# Patient Record
Sex: Female | Born: 1990 | Race: White | Hispanic: No | Smoking: Former smoker
Health system: Southern US, Community
[De-identification: ages and names within clinical notes are randomized; demographics above are authoritative.]

## PROBLEM LIST (undated history)

## (undated) DIAGNOSIS — Z6841 Body Mass Index (BMI) 40.0 and over, adult: Secondary | ICD-10-CM

## (undated) DIAGNOSIS — A749 Chlamydial infection, unspecified: Secondary | ICD-10-CM

## (undated) HISTORY — PX: TONSILLECTOMY: SUR1361

## (undated) HISTORY — DX: Morbid (severe) obesity due to excess calories: E66.01

## (undated) HISTORY — PX: WISDOM TOOTH EXTRACTION: SHX21

## (undated) HISTORY — DX: Chlamydial infection, unspecified: A74.9

## (undated) HISTORY — PX: INDUCED ABORTION: SHX677

## (undated) HISTORY — DX: Body Mass Index (BMI) 40.0 and over, adult: Z684

---

## 2014-09-08 ENCOUNTER — Emergency Department
Admit: 2014-09-08 | Disposition: A | Discharge status: Home / Self Care | Payer: Self-pay | Admitting: Emergency Medicine

## 2015-06-11 DIAGNOSIS — A749 Chlamydial infection, unspecified: Secondary | ICD-10-CM

## 2015-06-11 HISTORY — DX: Chlamydial infection, unspecified: A74.9

## 2017-04-15 ENCOUNTER — Telehealth: Payer: Self-pay

## 2017-04-15 MED ORDER — LEVONORGEST-ETH ESTRAD 91-DAY 0.15-0.03 &0.01 MG PO TABS: 1.0000 | ORAL_TABLET | Freq: Every day | ORAL | 0 refills | Status: DC

## 2017-04-15 NOTE — Telephone Encounter (Signed)
Pt states her AE is scheduled for 05/12/17. She needs a refill of OCP prior to her apt. [facility] chart reviewed. Pt on Seasonique & AE schedule w/[facility]. Cb#[phone].

## 2017-04-15 NOTE — Telephone Encounter (Signed)
Spoke w/pt. Verified OCP is still [facility] as last [facility] & [facility] [address] Pt will await notification from pharmacy that refill has been sent.

## 2017-04-15 NOTE — Telephone Encounter (Signed)
rx sent to pharmacy

## 2017-05-12 ENCOUNTER — Ambulatory Visit (INDEPENDENT_AMBULATORY_CARE_PROVIDER_SITE_OTHER): Payer: 59 | Admitting: Certified Nurse Midwife

## 2017-05-12 ENCOUNTER — Encounter: Payer: Self-pay | Admitting: Certified Nurse Midwife

## 2017-05-12 VITALS — BP 122/78 | HR 104 | Ht 68.0 in | Wt 235.0 lb

## 2017-05-12 DIAGNOSIS — Z113 Encounter for screening for infections with a predominantly sexual mode of transmission: Secondary | ICD-10-CM

## 2017-05-12 DIAGNOSIS — E669 Obesity, unspecified: Secondary | ICD-10-CM

## 2017-05-12 DIAGNOSIS — Z6841 Body Mass Index (BMI) 40.0 and over, adult: Secondary | ICD-10-CM

## 2017-05-12 DIAGNOSIS — Z01419 Encounter for gynecological examination (general) (routine) without abnormal findings: Secondary | ICD-10-CM | POA: Diagnosis not present

## 2017-05-12 DIAGNOSIS — Z3041 Encounter for surveillance of contraceptive pills: Secondary | ICD-10-CM

## 2017-05-12 DIAGNOSIS — F17200 Nicotine dependence, unspecified, uncomplicated: Secondary | ICD-10-CM

## 2017-05-12 DIAGNOSIS — Z124 Encounter for screening for malignant neoplasm of cervix: Secondary | ICD-10-CM

## 2017-05-12 HISTORY — DX: Nicotine dependence, unspecified, uncomplicated: F17.200

## 2017-05-12 MED ORDER — LEVONORGESTREL-ETHINYL ESTRAD 0.15-30 MG-MCG PO TABS: ORAL_TABLET | ORAL | 4 refills | Status: DC

## 2017-05-12 NOTE — Progress Notes (Addendum)
Gynecology Annual Exam  PCP: Patient, No Pcp Per  Chief Complaint:  Chief Complaint  Patient presents with  . Gynecologic Exam    History of Present Illness:Valerie Hines presents today for her annual gyn exam. She is a 26 year old female, G1 P0010, [facility] of her menorrhagia and dysmenorrhea. She reports that sometimes she has a light withdrawal bleed on her last week of pills and sometimes she doesn't. Her cramping has decreased significantly. There is a problem with the cost of the Seasonique at $180/ pack. The patient's past medical history is detailed in the past medical history section.  She is sexually active. She uses the OCPs for contraception.  She was treated for Chlamydia last year Her most recent pap smear was obtained 04/29/2016 and was NIL and positive for Chlamydia Mammogram is not applicable. There is no family history of breast cancer. There is no family history of ovarian cancer. The patient does do occasional self breast exams.  The patient smokes 1/2 ppd. She has quit smoking in the past cold [place], but started back after 3 years The patient does drink socially.  The patient does not use illegal drugs.  The patient exercises regularly.  The patient does not get adequate calcium in her diet. She is lactose intolerant. The patient does not take take a multivitamin.  She has not had a recent cholesterol screen and is not interested in labwork.    The patient denies current symptoms of depression.    Review of Systems: Review of Systems  Constitutional: Negative for chills, fever and weight loss.  HENT: Negative for congestion, sinus pain and sore throat.   Eyes: Negative for blurred vision and pain.  Respiratory: Negative for hemoptysis, shortness of breath and wheezing.   Cardiovascular: Negative for chest pain, palpitations and leg swelling.  Gastrointestinal: Negative for abdominal pain, blood in stool, diarrhea, heartburn,  nausea and vomiting.  Genitourinary: Negative for dysuria, frequency, hematuria and urgency.  Musculoskeletal: Negative for back pain, joint pain and myalgias.  Skin: Negative for itching and rash.  Neurological: Negative for dizziness, tingling and headaches.  Endo/Heme/Allergies: Negative for environmental allergies and polydipsia. Does not bruise/bleed easily.       Negative for hirsutism   Psychiatric/Behavioral: Negative for depression. The patient is not nervous/anxious and does not have insomnia.     Past Medical History:  Past Medical History:  Diagnosis Date  . Chlamydia 2017  . Obesity (BMI 30-39.9)     Past Surgical History:  Past Surgical History:  Procedure Laterality Date  . INDUCED ABORTION    . TONSILLECTOMY    . WISDOM TOOTH EXTRACTION      Family History:  Family History  Problem Relation Age of Onset  . Thyroid disease Mother   . Thyroid disease Maternal Grandmother   . Hypertension Maternal Grandmother   . Thyroid disease Maternal Grandfather   . Hypertension Maternal Grandfather     Social History:  Social History   Socioeconomic History  . Marital status: Divorced    Spouse name: Not on file  . Number of children: 0  . Years of education: Not on file  . Highest education level: Not on file  Social Needs  . Financial resource strain: Not on file  . Food insecurity - worry: Not on file  . Food insecurity - inability: Not on file  . Transportation needs - medical: Not on file  . Transportation needs - non-medical: Not on file  Occupational History  . Occupation: Public librarianhift Supervisor  Tobacco Use  . Smoking status: Current Every Day Smoker    Packs/day: 0.50    Types: Cigarettes  . Smokeless tobacco: Never Used  . Tobacco comment: started smoking at age 26, stopped for 3 years  Substance and Sexual Activity  . Alcohol use: Yes  . Drug use: No  . Sexual activity: Yes    Birth control/protection: Pill  Other Topics Concern  . Not on file    Social History Narrative  . Not on file    Allergies:  Allergies  Allergen Reactions  . Peanut-Containing Drug Products Anaphylaxis    Other tree nuts and soybeans also   Medications:  Current Outpatient Medications on File Prior to Visit  Medication Sig Dispense Refill  . Levonorgestrel-Ethinyl Estradiol (ASHLYNA) 0.15-0.03 &0.01 MG tablet Take 1 tablet daily by mouth. 1 Package 0   No current facility-administered medications on file prior to visit.    Vitamin C 1000mg m daily Zyrtec 10 mgm daily prn Lysine   Physical Exam Vitals: BP 122/78   Pulse (!) 104   Ht 5\' 8"  (1.727 m)   Wt 235 lb (106.6 kg)   BMI 35.73 kg/m .  General: WF in  NAD HEENT: normocephalic, anicteric Neck: no thyroid enlargement, no palpable nodules, no cervical lymphadenopathy  Pulmonary: No increased work of breathing, CTAB Cardiovascular: RRR, without murmur  Breast: Breast symmetrical, no tenderness, no palpable nodules or masses, no skin or nipple retraction present, no nipple discharge.  No axillary, infraclavicular or supraclavicular lymphadenopathy. Abdomen: Soft, non-tender, non-distended.  Umbilicus without lesions.  No hepatomegaly or masses palpable. No evidence of hernia. Genitourinary:  External: Normal external female genitalia.  Normal urethral meatus, normal Bartholin's and Skene's glands.    Vagina: Normal vaginal mucosa, no evidence of prolapse.    Cervix: Grossly normal in appearance, friable endocervix with Pap, non-tender  Uterus: Retroverted, deviated to left; normal size, shape, and consistency, decreased mobility, and non-tender  Adnexa: No adnexal masses, non-tender  Rectal: deferred  Lymphatic: no evidence of inguinal lymphadenopathy Extremities: no edema, erythema, or tenderness Neurologic: Grossly intact Psychiatric: mood appropriate, affect full     Assessment: 26 y.o. annual gyn exam  Plan:   1) Breast cancer screening - recommend monthly self breast exam.    2) STI screening was done.   3) Cervical cancer screening - Pap was done. ASCCP guidelines and rational discussed.  Patient opts for yearly screening interval  4) Contraception -will try continuous Portia x 3 packs and see if cost is less and if she does as well on it.  5) Routine healthcare maintenance including cholesterol and diabetes screening declined   6) Recommend taking a calcium and vitamin D3 suuplement.  7) Discussed smoking cessation and she states she is not ready yet  8) RTO in 1 year and prn  Farrel Connersolleen Demba Nigh, CNM

## 2017-05-14 LAB — PAP IG, CT-NG, RFX HPV ALL
Chlamydia, Nuc. Acid Amp: POSITIVE — AB
GONOCOCCUS BY NUCLEIC ACID AMP: NEGATIVE
PAP SMEAR COMMENT: 0

## 2017-05-16 ENCOUNTER — Telehealth: Payer: Self-pay | Admitting: Certified Nurse Midwife

## 2017-05-19 ENCOUNTER — Other Ambulatory Visit: Payer: Self-pay | Admitting: Certified Nurse Midwife

## 2017-05-19 ENCOUNTER — Encounter: Payer: Self-pay | Admitting: Certified Nurse Midwife

## 2017-05-19 MED ORDER — AZITHROMYCIN 500 MG PO TABS: ORAL_TABLET | ORAL | 0 refills | Status: DC

## 2017-05-19 NOTE — Telephone Encounter (Signed)
Patient caleed and advised of positive Chlamydia NAAT. WIll treat with 1 gm Azithromycin for self and partner. No intercourse x1 week after both she and partner are treated.  Farrel Connersolleen Adamari Frede, CNM

## 2018-05-22 ENCOUNTER — Ambulatory Visit (INDEPENDENT_AMBULATORY_CARE_PROVIDER_SITE_OTHER): Payer: 59 | Admitting: Certified Nurse Midwife

## 2018-05-22 ENCOUNTER — Encounter: Payer: Self-pay | Admitting: Certified Nurse Midwife

## 2018-05-22 ENCOUNTER — Other Ambulatory Visit (HOSPITAL_COMMUNITY)
Admission: RE | Admit: 2018-05-22 | Discharge: 2018-05-22 | Disposition: A | Discharge status: Home / Self Care | Payer: 59 | Source: Ambulatory Visit | Attending: Certified Nurse Midwife | Admitting: Certified Nurse Midwife

## 2018-05-22 VITALS — BP 120/70 | HR 97 | Ht 68.0 in | Wt 285.0 lb

## 2018-05-22 DIAGNOSIS — Z124 Encounter for screening for malignant neoplasm of cervix: Secondary | ICD-10-CM

## 2018-05-22 DIAGNOSIS — R635 Abnormal weight gain: Secondary | ICD-10-CM

## 2018-05-22 DIAGNOSIS — Z13220 Encounter for screening for lipoid disorders: Secondary | ICD-10-CM

## 2018-05-22 DIAGNOSIS — Z01419 Encounter for gynecological examination (general) (routine) without abnormal findings: Secondary | ICD-10-CM

## 2018-05-22 DIAGNOSIS — Z131 Encounter for screening for diabetes mellitus: Secondary | ICD-10-CM

## 2018-05-22 DIAGNOSIS — Z8349 Family history of other endocrine, nutritional and metabolic diseases: Secondary | ICD-10-CM

## 2018-05-22 DIAGNOSIS — Z716 Tobacco abuse counseling: Secondary | ICD-10-CM

## 2018-05-22 DIAGNOSIS — Z113 Encounter for screening for infections with a predominantly sexual mode of transmission: Secondary | ICD-10-CM

## 2018-05-22 DIAGNOSIS — Z6841 Body Mass Index (BMI) 40.0 and over, adult: Secondary | ICD-10-CM

## 2018-05-22 DIAGNOSIS — E66813 Obesity, class 3: Secondary | ICD-10-CM

## 2018-05-22 MED ORDER — LEVONORGESTREL-ETHINYL ESTRAD 0.15-30 MG-MCG PO TABS: ORAL_TABLET | ORAL | 4 refills | Status: DC

## 2018-05-22 MED ORDER — VARENICLINE TARTRATE 0.5 MG X 11 & 1 MG X 42 PO MISC: ORAL | 0 refills | Status: DC

## 2018-05-22 NOTE — Progress Notes (Addendum)
Gynecology Annual Exam  PCP: Patient, No Pcp Per  Chief Complaint:  Chief Complaint  Patient presents with  . Gynecologic Exam    History of Present Illness:Valerie Hines is a 27 year old G1 P0010 who presents today for her annual gyn exam. She denies any She is sexually active. She uses the OCPs for contraception. She takes Potria continuously x 3 packs before taking placebos. Her menses are light and last 2 days. She has no dysmenorrhea. Has occasional break through bleeding. She was treated for Chlamydia the last 2 years. Her most recent pap smear was obtained 05/12/2017 and was NIL and positive for Chlamydia Since her last exam 05/12/2017, she has gained 50 pounds. She had gone through some stressful times in the last two years and is now divorced, but she says she is in a good place now. Wants to lose weight and is trying to incorporate exercise into her schedule. Has been eating a lot of fast food due to work schedule. Mammogram is not applicable. There is no family history of breast cancer. There is no family history of ovarian cancer. The patient does do occasional self breast exams.  The patient smokes 1/2 ppd. She has quit smoking in the past cold [place], but started back after 3 years. She then tried to quit using nicotine gum and nicotine patches (tried each for 3 months about 4 years ago) with no success. She is inquiring about a RX for Chantix. Her father recently quit using Chantix. The patient does drink socially.  The patient does not use illegal drugs.  The patient is trying to get back to exercising regularly.  The patient does not get adequate calcium in her diet. She is lactose intolerant. The patient does take a calcium supplement She has not had a recent cholesterol screen and is interested in labwork.    The patient denies current symptoms of depression.    Review of Systems: Review of Systems  Constitutional: Negative for chills, fever and weight loss.   Positive for weight gain  HENT: Negative for congestion, sinus pain and sore throat.   Eyes: Negative for blurred vision and pain.  Respiratory: Negative for hemoptysis, shortness of breath and wheezing.   Cardiovascular: Negative for chest pain, palpitations and leg swelling.  Gastrointestinal: Negative for abdominal pain, blood in stool, diarrhea, heartburn, nausea and vomiting.  Genitourinary: Negative for dysuria, frequency, hematuria and urgency.  Musculoskeletal: Negative for back pain, joint pain and myalgias.  Skin: Negative for itching and rash.  Neurological: Negative for dizziness, tingling and headaches.  Endo/Heme/Allergies: Negative for environmental allergies and polydipsia. Does not bruise/bleed easily.       Negative for hirsutism   Psychiatric/Behavioral: Negative for depression. The patient is not nervous/anxious and does not have insomnia.     Past Medical History:  Past Medical History:  Diagnosis Date  . Chlamydia 2017/ 2018  . Morbid obesity with BMI of 40.0-44.9, adult [facility])     Past Surgical History:  Past Surgical History:  Procedure Laterality Date  . INDUCED ABORTION    . TONSILLECTOMY    . WISDOM TOOTH EXTRACTION      Family History:  Family History  Problem Relation Age of Onset  . Thyroid disease Mother   . Thyroid disease Maternal Grandmother   . Hypertension Maternal Grandmother   . Thyroid disease Maternal Grandfather   . Hypertension Maternal Grandfather     Social History:  Social History   Socioeconomic History  .  Marital status: Divorced    Spouse name: Not on file  . Number of children: 0  . Years of education: Not on file  . Highest education level: Not on file  Occupational History  . Occupation: Public librarianhift Supervisor  Social Needs  . Financial resource strain: Not on file  . Food insecurity:    Worry: Not on file    Inability: Not on file  . Transportation needs:    Medical: Not on file    Non-medical: Not on file    Tobacco Use  . Smoking status: Current Every Day Smoker    Packs/day: 0.50    Types: Cigarettes  . Smokeless tobacco: Never Used  . Tobacco comment: started smoking at age 27, stopped for 3 years  Substance and Sexual Activity  . Alcohol use: Yes    Comment: socially  . Drug use: No  . Sexual activity: Yes    Birth control/protection: Pill  Lifestyle  . Physical activity:    Days per week: 1 day    Minutes per session: 60 min  . Stress: To some extent  Relationships  . Social connections:    Talks on phone: More than three times a week    Gets together: Twice a week    Attends religious service: Never    Active member of club or organization: No    Attends meetings of clubs or organizations: Never    Relationship status: Divorced  . Intimate partner violence:    Fear of current or ex partner: No    Emotionally abused: No    Physically abused: No    Forced sexual activity: No  Other Topics Concern  . Not on file  Social History Narrative  . Not on file    Allergies:  Allergies  Allergen Reactions  . Peanut-Containing Drug Products Anaphylaxis    Other tree nuts and soybeans also    Medication:  Current Outpatient Medications:  .  levonorgestrel-ethinyl estradiol (NORDETTE) 0.15-30 MG-MCG tablet, Take one active tablet daily x 3 packs, skipping the placebos in the first two packs, and repeat cycle, Disp: 3 Package, Rfl: 4 .Physical Exam Vitals: BP 120/70   Pulse 97   Ht 5\' 8"  (1.727 m)   Wt 285 lb (129.3 kg)   BMI 43.33 kg/m .  General: WF in  NAD HEENT: normocephalic, anicteric Neck: no thyroid enlargement, no palpable nodules, no cervical lymphadenopathy  Pulmonary: No increased work of breathing, CTAB Cardiovascular: RRR, without murmur  Breast: Breast symmetrical, no tenderness, no palpable nodules or masses, no skin or nipple retraction present, no nipple discharge.  No axillary, infraclavicular or supraclavicular lymphadenopathy. Abdomen: Soft,  non-tender, non-distended.  Umbilicus without lesions.  No hepatomegaly or masses palpable. No evidence of hernia. Genitourinary:  External: Normal external female genitalia.  Normal urethral meatus, normal Bartholin's and Skene's glands.    Vagina: Normal vaginal mucosa, no evidence of prolapse.    Cervix: Grthyroidossly normal in appearance, friable endocervix with Pap, non-tender  Uterus: Retroverted, deviated to left; normal size, shape, and consistency, decreased mobility, and non-tender  Adnexa: No adnexal masses, non-tender  Rectal: deferred  Lymphatic: no evidence of inguinal lymphadenopathy Extremities: no edema, erythema, or tenderness Neurologic: Grossly intact Psychiatric: mood appropriate, affect full     Assessment: 27 y.o. annual gyn exam Large interval weight gain Family history of thyroid disorder Tobacco use disorder  Plan:   1) Breast cancer screening - recommend monthly self breast exam.   2) STI screening was done.  3) Cervical cancer screening - Pap was done. ASCCP guidelines and rational discussed.  Patient opts for yearly screening interval  4) Contraception -RX: continuous Portia 3 packs x 4 RF  5) Routine healthcare maintenance including cholesterol and diabetes screening done   6) Recommend continuing a calcium and vitamin D3 suuplement.  7) Discussed smoking cessation an the use of Chantix. Discussed how to take, timing of stopping smoking, possible side effects including weird dreams. Also advised to not mix alcohol and Chantix and to report any suicidal ideation. Also given the 1 800 quit line and info on the support line from [employer].  8) RTO in 1 year and prn. Will notify of lab results.  Farrel Conners, CNM

## 2018-05-22 NOTE — Patient Instructions (Signed)
Varenicline oral tablets What is this medicine? VARENICLINE (var EN i kleen) is used to help people quit smoking. It can reduce the symptoms caused by stopping smoking. It is used with a patient support program recommended by your physician. This medicine may be used for other purposes; ask your health care provider or pharmacist if you have questions. COMMON BRAND NAME(S): Chantix What should I tell my health care provider before I take this medicine? They need to know if you have any of these conditions: -bipolar disorder, depression, schizophrenia or other mental illness -heart disease -if you often drink alcohol -kidney disease -peripheral vascular disease -seizures -stroke -suicidal thoughts, plans, or attempt; a previous suicide attempt by you or a family member -an unusual or allergic reaction to varenicline, other medicines, foods, dyes, or preservatives -pregnant or trying to get pregnant -breast-feeding How should I use this medicine? Take this medicine by mouth after eating. Take with a full glass of water. Follow the directions on the prescription label. Take your doses at regular intervals. Do not take your medicine more often than directed. There are 3 ways you can use this medicine to help you quit smoking; talk to your health care professional to decide which plan is right for you: 1) you can choose a quit date and start this medicine 1 week before the quit date, or, 2) you can start taking this medicine before you choose a quit date, and then pick a quit date between day 8 and 35 days of treatment, or, 3) if you are not sure that you are able or willing to quit smoking right away, start taking this medicine and slowly decrease the amount you smoke as directed by your health care professional with the goal of being cigarette-free by week 12 of treatment. Stick to your plan; ask about support groups or other ways to help you remain cigarette-free. If you are motivated to quit  smoking and did not succeed during a previous attempt with this medicine for reasons other than side effects, or if you returned to smoking after this treatment, speak with your health care professional about whether another course of this medicine may be right for you. A special MedGuide will be given to you by the pharmacist with each prescription and refill. Be sure to read this information carefully each time. Talk to your pediatrician regarding the use of this medicine in children. This medicine is not approved for use in children. Overdosage: If you think you have taken too much of this medicine contact a poison control center or emergency room at once. NOTE: This medicine is only for you. Do not share this medicine with others. What if I miss a dose? If you miss a dose, take it as soon as you can. If it is almost time for your next dose, take only that dose. Do not take double or extra doses. What may interact with this medicine? -alcohol or any product that contains alcohol -insulin -other stop smoking aids -theophylline -warfarin This list may not describe all possible interactions. Give your health care provider a list of all the medicines, herbs, non-prescription drugs, or dietary supplements you use. Also tell them if you smoke, drink alcohol, or use illegal drugs. Some items may interact with your medicine. What should I watch for while using this medicine? Visit your doctor or health care professional for regular check ups. Ask for ongoing advice and encouragement from your doctor or healthcare professional, friends, and family to help you quit. If   you smoke while on this medication, quit again Your mouth may get dry. Chewing sugarless gum or sucking hard candy, and drinking plenty of water may help. Contact your doctor if the problem does not go away or is severe. You may get drowsy or dizzy. Do not drive, use machinery, or do anything that needs mental alertness until you know how  this medicine affects you. Do not stand or sit up quickly, especially if you are an older patient. This reduces the risk of dizzy or fainting spells. Sleepwalking can happen during treatment with this medicine, and can sometimes lead to behavior that is harmful to you, other people, or property. Stop taking this medicine and tell your doctor if you start sleepwalking or have other unusual sleep-related activity. Decrease the amount of alcoholic beverages that you drink during treatment with this medicine until you know if this medicine affects your ability to tolerate alcohol. Some people have experienced increased drunkenness (intoxication), unusual or sometimes aggressive behavior, or no memory of things that have happened (amnesia) during treatment with this medicine. The use of this medicine may increase the chance of suicidal thoughts or actions. Pay special attention to how you are responding while on this medicine. Any worsening of mood, or thoughts of suicide or dying should be reported to your health care professional right away. What side effects may I notice from receiving this medicine? Side effects that you should report to your doctor or health care professional as soon as possible: -allergic reactions like skin rash, itching or hives, swelling of the face, lips, tongue, or throat -acting aggressive, being angry or violent, or acting on dangerous impulses -breathing problems -changes in vision -chest pain or chest tightness -confusion, trouble speaking or understanding -new or worsening depression, anxiety, or panic attacks -extreme increase in activity and talking (mania) -fast, irregular heartbeat -feeling faint or lightheaded, falls -fever -pain in legs when walking -problems with balance, talking, walking -redness, blistering, peeling or loosening of the skin, including inside the mouth -ringing in ears -seeing or hearing things that aren't there  (hallucinations) -seizures -sleepwalking -sudden numbness or weakness of the face, arm or leg -thoughts about suicide or dying, or attempts to commit suicide -trouble passing urine or change in the amount of urine -unusual bleeding or bruising -unusually weak or tired Side effects that usually do not require medical attention (report to your doctor or health care professional if they continue or are bothersome): -constipation -headache -nausea, vomiting -strange dreams -stomach gas -trouble sleeping This list may not describe all possible side effects. Call your doctor for medical advice about side effects. You may report side effects to FDA at 1-800-FDA-1088. Where should I keep my medicine? Keep out of the reach of children. Store at room temperature between 15 and 30 degrees C (59 and 86 degrees F). Throw away any unused medicine after the expiration date. NOTE: This sheet is a summary. It may not cover all possible information. If you have questions about this medicine, talk to your doctor, pharmacist, or health care provider.  2018 Elsevier/Gold Standard (2015-02-09 16:14:23) Coping with Quitting Smoking Quitting smoking is a physical and mental challenge. You will face cravings, withdrawal symptoms, and temptation. Before quitting, work with your health care provider to make a plan that can help you cope. Preparation can help you quit and keep you from giving in. How can I cope with cravings? Cravings usually last for 5-10 minutes. If you get through it, the craving will pass. Consider taking the   following actions to help you cope with cravings:  Keep your mouth busy: ? Chew sugar-free gum. ? Suck on hard candies or a straw. ? Brush your teeth.  Keep your hands and body busy: ? Immediately change to a different activity when you feel a craving. ? Squeeze or play with a ball. ? Do an activity or a hobby, like making bead jewelry, practicing needlepoint, or working with  wood. ? Mix up your normal routine. ? Take a short exercise break. Go for a quick walk or run up and down stairs. ? Spend time in public places where smoking is not allowed.  Focus on doing something kind or helpful for someone else.  Call a friend or family member to talk during a craving.  Join a support group.  Call a quit line, such as 1-800-QUIT-NOW.  Talk with your health care provider about medicines that might help you cope with cravings and make quitting easier for you.  How can I deal with withdrawal symptoms? Your body may experience negative effects as it tries to get used to not having nicotine in the system. These effects are called withdrawal symptoms. They may include:  Feeling hungrier than normal.  Trouble concentrating.  Irritability.  Trouble sleeping.  Feeling depressed.  Restlessness and agitation.  Craving a cigarette.  To manage withdrawal symptoms:  Avoid places, people, and activities that trigger your cravings.  Remember why you want to quit.  Get plenty of sleep.  Avoid coffee and other caffeinated drinks. These may worsen some of your symptoms.  How can I handle social situations? Social situations can be difficult when you are quitting smoking, especially in the first few weeks. To manage this, you can:  Avoid parties, bars, and other social situations where people might be smoking.  Avoid alcohol.  Leave right away if you have the urge to smoke.  Explain to your family and friends that you are quitting smoking. Ask for understanding and support.  Plan activities with friends or family where smoking is not an option.  What are some ways I can cope with stress? Wanting to smoke may cause stress, and stress can make you want to smoke. Find ways to manage your stress. Relaxation techniques can help. For example:  Breathe slowly and deeply, in through your nose and out through your mouth.  Listen to soothing, relaxing  music.  Talk with a family member or friend about your stress.  Light a candle.  Soak in a bath or take a shower.  Think about a peaceful place.  What are some ways I can prevent weight gain? Be aware that many people gain weight after they quit smoking. However, not everyone does. To keep from gaining weight, have a plan in place before you quit and stick to the plan after you quit. Your plan should include:  Having healthy snacks. When you have a craving, it may help to: ? Eat plain popcorn, crunchy carrots, celery, or other cut vegetables. ? Chew sugar-free gum.  Changing how you eat: ? Eat small portion sizes at meals. ? Eat 4-6 small meals throughout the day instead of 1-2 large meals a day. ? Be mindful when you eat. Do not watch television or do other things that might distract you as you eat.  Exercising regularly: ? Make time to exercise each day. If you do not have time for a long workout, do short bouts of exercise for 5-10 minutes several times a day. ? Do some form   of strengthening exercise, like weight lifting, and some form of aerobic exercise, like running or swimming.  Drinking plenty of water or other low-calorie or no-calorie drinks. Drink 6-8 glasses of water daily, or as much as instructed by your health care provider.  Summary  Quitting smoking is a physical and mental challenge. You will face cravings, withdrawal symptoms, and temptation to smoke again. Preparation can help you as you go through these challenges.  You can cope with cravings by keeping your mouth busy (such as by chewing gum), keeping your body and hands busy, and making calls to family, friends, or a helpline for people who want to quit smoking.  You can cope with withdrawal symptoms by avoiding places where people smoke, avoiding drinks with caffeine, and getting plenty of rest.  Ask your health care provider about the different ways to prevent weight gain, avoid stress, and handle social  situations. This information is not intended to replace advice given to you by your health care provider. Make sure you discuss any questions you have with your health care provider. Document Released: 05/24/2016 Document Revised: 05/24/2016 Document Reviewed: 05/24/2016 Elsevier Interactive Patient Education  2018 Elsevier Inc.  

## 2018-05-23 LAB — LIPID PANEL WITH LDL/HDL RATIO
Cholesterol, Total: 153 mg/dL (ref 100–199)
HDL: 46 mg/dL (ref >39)
LDL CALC: 87 mg/dL (ref 0–99)
LDl/HDL Ratio: 1.9 ratio (ref 0.0–3.2)
Triglycerides: 101 mg/dL (ref 0–149)
VLDL CHOLESTEROL CAL: 20 mg/dL (ref 5–40)

## 2018-05-23 LAB — HGB A1C W/O EAG: Hgb A1c MFr Bld: 5.1 % (ref 4.8–5.6)

## 2018-05-23 LAB — TSH: TSH: 1.36 u[IU]/mL (ref 0.450–4.500)

## 2018-05-24 ENCOUNTER — Encounter: Payer: Self-pay | Admitting: Certified Nurse Midwife

## 2018-05-26 LAB — CYTOLOGY - PAP
CHLAMYDIA, DNA PROBE: NEGATIVE
Diagnosis: NEGATIVE
Neisseria Gonorrhea: NEGATIVE
TRICH (WINDOWPATH): POSITIVE — AB

## 2018-05-29 ENCOUNTER — Telehealth: Payer: Self-pay | Admitting: Certified Nurse Midwife

## 2018-05-29 ENCOUNTER — Encounter: Payer: Self-pay | Admitting: Certified Nurse Midwife

## 2018-05-29 MED ORDER — TINIDAZOLE 500 MG PO TABS: ORAL_TABLET | ORAL | 0 refills | Status: DC

## 2018-05-29 MED ORDER — FLUCONAZOLE 150 MG PO TABS: 150.0000 mg | ORAL_TABLET | Freq: Once | ORAL | 0 refills | Status: AC

## 2018-05-29 NOTE — Telephone Encounter (Signed)
Patient called with lab results. Hemoglobin A1C, lipid panel, TSH all normal. Her Pap smear was NIL, but positive for fungal elements and Trichimonads. Recommend treating her and her partner for Trichimoniasis with Tindamax (with food, no alcohol) and her with Diflucan. No IC x 1 week after treatment. Valerie Hines, CNM

## 2018-06-17 ENCOUNTER — Other Ambulatory Visit: Payer: Self-pay | Admitting: Certified Nurse Midwife

## 2018-06-24 ENCOUNTER — Other Ambulatory Visit: Payer: Self-pay | Admitting: Certified Nurse Midwife

## 2018-09-16 ENCOUNTER — Emergency Department: Payer: 59

## 2018-09-16 ENCOUNTER — Emergency Department
Admission: EM | Admit: 2018-09-16 | Discharge: 2018-09-16 | Disposition: A | Discharge status: Home / Self Care | Payer: 59 | Attending: Emergency Medicine | Admitting: Emergency Medicine

## 2018-09-16 ENCOUNTER — Other Ambulatory Visit: Payer: Self-pay

## 2018-09-16 DIAGNOSIS — F17210 Nicotine dependence, cigarettes, uncomplicated: Secondary | ICD-10-CM | POA: Insufficient documentation

## 2018-09-16 DIAGNOSIS — J9801 Acute bronchospasm: Secondary | ICD-10-CM | POA: Diagnosis not present

## 2018-09-16 DIAGNOSIS — Z91010 Allergy to peanuts: Secondary | ICD-10-CM | POA: Diagnosis not present

## 2018-09-16 DIAGNOSIS — R05 Cough: Secondary | ICD-10-CM | POA: Diagnosis present

## 2018-09-16 DIAGNOSIS — Z79899 Other long term (current) drug therapy: Secondary | ICD-10-CM | POA: Diagnosis not present

## 2018-09-16 DIAGNOSIS — R6889 Other general symptoms and signs: Secondary | ICD-10-CM

## 2018-09-16 DIAGNOSIS — Z20828 Contact with and (suspected) exposure to other viral communicable diseases: Secondary | ICD-10-CM | POA: Diagnosis not present

## 2018-09-16 DIAGNOSIS — Z20822 Contact with and (suspected) exposure to covid-19: Secondary | ICD-10-CM

## 2018-09-16 MED ORDER — ALBUTEROL SULFATE HFA 108 (90 BASE) MCG/ACT IN AERS: 2.0000 | INHALATION_SPRAY | Freq: Four times a day (QID) | RESPIRATORY_TRACT | 2 refills | Status: AC | PRN

## 2018-09-16 NOTE — ED Provider Notes (Signed)
[facility] Emergency Department Provider Note   ____________________________________________   First MD Initiated Contact with Patient 09/16/18 2113     (approximate)  I have reviewed the triage vital signs and the nursing notes.   HISTORY  Chief Complaint Cough    HPI Valerie Hines is a 28 y.o. female presents for evaluation for "flulike symptoms"   The patient started yesterday to have a very dry cough and some slight shortness of breath.  She notes she has been having some "wheezing" and quit smoking yesterday.  As she has been feeling like she should have a fever, body aches.  No nausea or vomiting.  She reports that it feels just like she had the flu, but she had the flu earlier and reports she should not have it again  She does work at [employer], she has contact with multiple people there.  Denies pregnancy.  No abdominal pain.  Reports just a light feeling of shortness of breath with wheezing a lot of dry coughing  Denies history of pneumonia.  No lightheadedness or weakness.  Just reports she feels miserable with body aches and coughing.  No sharp chest pains.  No leg swelling.  No history of blood clots.  No recent long trips or travel.  Past Medical History:  Diagnosis Date  . Chlamydia 2017/ 2018  . Morbid obesity with BMI of 40.0-44.9, adult [facility](HCC)     Patient Active Problem List   Diagnosis Date Noted  . Tobacco use disorder 05/12/2017  . Morbid obesity with BMI of 40.0-44.9, adult [facility](HCC)     Past Surgical History:  Procedure Laterality Date  . INDUCED ABORTION    . TONSILLECTOMY    . WISDOM TOOTH EXTRACTION      Prior to Admission medications   Medication Sig Start Date End Date Taking? Authorizing Provider  albuterol (PROVENTIL HFA;VENTOLIN HFA) 108 (90 Base) MCG/ACT inhaler Inhale 2 puffs into the lungs every 6 (six) hours as needed for wheezing or shortness of breath. 09/16/18   Sharyn CreamerQuale, Mark, MD  levonorgestrel-ethinyl  estradiol (NORDETTE) 0.15-30 MG-MCG tablet Take one active tablet daily x 3 packs, skipping the placebos in the first two packs, and repeat cycle 05/22/18   Farrel ConnersGutierrez, Colleen, CNM  tinidazole [facility](TINDAMAX) 500 MG tablet Take 2 GM po x1 with food, may repeat if needed 05/29/18   Farrel ConnersGutierrez, Colleen, CNM  varenicline (CHANTIX STARTING MONTH PAK) 0.5 MG X 11 & 1 MG X 42 tablet Take one 0.5 mg tablet by mouth once daily for 3 days, then increase to one 0.5 mg tablet twice daily for 4 days, then increase to one 1 mg tablet twice daily. 05/22/18   Farrel ConnersGutierrez, Colleen, CNM    Allergies Peanut-containing drug products  Family History  Problem Relation Age of Onset  . Thyroid disease Mother   . Thyroid disease Maternal Grandmother   . Hypertension Maternal Grandmother   . Thyroid disease Maternal Grandfather   . Hypertension Maternal Grandfather     Social History Social History   Tobacco Use  . Smoking status: Current Every Day Smoker    Packs/day: 0.50    Types: Cigarettes  . Smokeless tobacco: Never Used  . Tobacco comment: started smoking at age 89, stopped for 3 years  Substance Use Topics  . Alcohol use: Yes    Comment: socially  . Drug use: No    Review of Systems Constitutional: Fevers chills and some slight fatigue Eyes: No visual changes. ENT: No sore throat. Cardiovascular:  Denies chest pain. Respiratory: See HPI Gastrointestinal: No abdominal pain.   Genitourinary: Negative for dysuria. Musculoskeletal: Negative for back pain. Skin: Negative for rash. Neurological: Negative for areas of focal weakness or numbness.    ____________________________________________   PHYSICAL EXAM:  VITAL SIGNS: ED Triage Vitals  Enc Vitals Group     BP 09/16/18 2053 (!) 167/99     Pulse Rate 09/16/18 2053 95     Resp 09/16/18 2053 20     Temp 09/16/18 2053 99 F (37.2 C)     Temp Source 09/16/18 2053 Oral     SpO2 09/16/18 2053 97 %     Weight 09/16/18 2054 270 lb (122.5 kg)      Height 09/16/18 2053  (1.727 m)     Head Circumference --      Peak Flow --      Pain Score 09/16/18 2053 5     Pain Loc --      Pain Edu? --      Excl. in GC? --     Constitutional: Alert and oriented.  Mildly ill-appearing and in no acute distress. Eyes: Conjunctivae are normal. Head: Atraumatic. Nose: No congestion/rhinnorhea. Mouth/Throat: Mucous membranes are moist. Neck: No stridor.  Cardiovascular: Normal rate, regular rhythm. Grossly normal heart sounds.  Good peripheral circulation. Respiratory: Normal respiratory effort.  No retractions. Lungs CTAB except for some scant expiratory wheezing heard centrally.  She speaks in full and clear sentences without use of accessory muscles and her oxygen saturation is normal. Gastrointestinal: Soft and nontender. No distention. Musculoskeletal: No lower extremity tenderness nor edema.  She does seem to have some myalgias complains of achiness in her muscles with movement. Neurologic:  Normal speech and language. No gross focal neurologic deficits are appreciated.  Skin:  Skin is warm, dry and intact. No rash noted. Psychiatric: Mood and affect are normal. Speech and behavior are normal.  ____________________________________________   LABS (all labs ordered are listed, but only abnormal results are displayed)  Labs Reviewed - No data to display ____________________________________________  EKG  Reviewed interpreted me at 2105 Heart rate 90 QRS 100 QTc 440 Normal sinus rhythm, no evidence of ischemia or ectopy ____________________________________________  RADIOLOGY  Chest x-ray reviewed negative ____________________________________________   PROCEDURES  Procedure(s) performed: None  Procedures  Critical Care performed: No  ____________________________________________   INITIAL IMPRESSION / ASSESSMENT AND PLAN / ED COURSE  Pertinent labs & imaging results that were available during my care of the patient  were reviewed by me and considered in my medical decision making (see chart for details).   Patient presents with upper respiratory symptoms including dry cough, muscle aches subjective chills and fevers.  She is awake alert and respirating comfortably.  No pleuritic chest pain.  No hypoxia or tachycardia.  Her clinical symptomatology with myalgias, cough, fever subjectively muscle aches in this setting is concerning for potentially upper respiratory infection, bronchitis/bronchospasm, influenza though this seems doubtful given her reports that she had the flu once already this year, or what I suspect COVID-19  Valerie Hines was evaluated in Emergency Department on 09/16/2018 for the symptoms described in the history of present illness. She was evaluated in the context of the global COVID-19 pandemic, which necessitated consideration that the patient might be at risk for infection with the SARS-CoV-2 virus that causes COVID-19. Institutional protocols and algorithms that pertain to the evaluation of patients at risk for COVID-19 are in a state of rapid change based on information released by  regulatory bodies including the CDC and federal and state organizations. These policies and algorithms were followed during the patient's care in the ED.  ----------------------------------------- 10:19 PM on 09/16/2018 -----------------------------------------  Patient resting comfortably.  Repeat vital signs also reassuring.  Suspect COVID-19, does not qualify for test at this time as she does not need to be admitted.  We will discharge the patient to home, careful return precautions given and also prescription for inhaler for as needed use with bronchospasm.  Return precautions and treatment recommendations and follow-up discussed with the patient who is agreeable with the plan.     Vitals:   09/16/18 2053 09/16/18 2208  BP: (!) 167/99 (!) 110/59  Pulse: 95 87  Resp: 20 18  Temp: 99 F (37.2 C) 98.8 F  (37.1 C)  SpO2: 97% 100%     ____________________________________________   FINAL CLINICAL IMPRESSION(S) / ED DIAGNOSES  Final diagnoses:  Bronchospasm, acute  Suspected Covid-19 Virus Infection        Note:  This document was prepared using Dragon voice recognition software and may include unintentional dictation errors       Sharyn Creamer, MD 09/16/18 2220

## 2018-09-16 NOTE — ED Triage Notes (Signed)
Pt in with co dry cough since yesterday with shob. States has had chills at home, states feel like flu like symptoms.

## 2018-09-16 NOTE — Discharge Instructions (Addendum)
Person Under Monitoring Name: Valerie Hines  Location: [address] [place] [address]   Infection Prevention Recommendations for Individuals Confirmed to have, or Being Evaluated for, 2019 Novel Coronavirus (COVID-19) Infection Who Receive Care at Home  Individuals who are confirmed to have, or are being evaluated for, COVID-19 should follow the prevention steps below until a healthcare provider or local or state health department says they can return to normal activities.  Stay home except to get medical care You should restrict activities outside your home, except for getting medical care. Do not go to work, school, or public areas, and do not use public transportation or taxis.  Call ahead before visiting your doctor Before your medical appointment, call the healthcare provider and tell them that you have, or are being evaluated for, COVID-19 infection. This will help the healthcare providers office take steps to keep other people from getting infected. Ask your healthcare provider to call the local or state health department.  Monitor your symptoms Seek prompt medical attention if your illness is worsening (e.g., difficulty breathing). Before going to your medical appointment, call the healthcare provider and tell them that you have, or are being evaluated for, COVID-19 infection. Ask your healthcare provider to call the local or state health department.  Wear a facemask You should wear a facemask that covers your nose and mouth when you are in the same room with other people and when you visit a healthcare provider. People who live with or visit you should also wear a facemask while they are in the same room with you.  Separate yourself from other people in your home As much as possible, you should stay in a different room from other people in your home. Also, you should use a separate bathroom, if available.  Avoid sharing household items You  should not share dishes, drinking glasses, cups, eating utensils, towels, bedding, or other items with other people in your home. After using these items, you should wash them thoroughly with soap and water.  Cover your coughs and sneezes Cover your mouth and nose with a tissue when you cough or sneeze, or you can cough or sneeze into your sleeve. Throw used tissues in a lined trash can, and immediately wash your hands with soap and water for at least 20 seconds or use an alcohol-based hand rub.  Wash your [employer] your hands often and thoroughly with soap and water for at least 20 seconds. You can use an alcohol-based hand sanitizer if soap and water are not available and if your hands are not visibly dirty. Avoid touching your eyes, nose, and mouth with unwashed hands.   Prevention Steps for Caregivers and Household Members of Individuals Confirmed to have, or Being Evaluated for, COVID-19 Infection Being Cared for in the Home  If you live with, or provide care at home for, a person confirmed to have, or being evaluated for, COVID-19 infection please follow these guidelines to prevent infection:  Follow healthcare providers instructions Make sure that you understand and can help the patient follow any healthcare provider instructions for all care.  Provide for the patients basic needs You should help the patient with basic needs in the home and provide support for getting groceries, prescriptions, and other personal needs.  Monitor the patients symptoms If they are getting sicker, call his or her medical provider and tell them that the patient has, or is being evaluated for, COVID-19 infection. This will help the  healthcare providers office take steps to keep other people from getting infected. Ask the healthcare provider to call the local or state health department.  Limit the number of people who have contact with the patient If possible, have only one caregiver for the  patient. Other household members should stay in another home or place of residence. If this is not possible, they should stay in another room, or be separated from the patient as much as possible. Use a separate bathroom, if available. Restrict visitors who do not have an essential need to be in the home.  Keep older adults, very young children, and other sick people away from the patient Keep older adults, very young children, and those who have compromised immune systems or chronic health conditions away from the patient. This includes people with chronic heart, lung, or kidney conditions, diabetes, and cancer.  Ensure good ventilation Make sure that shared spaces in the home have good air flow, such as from an air conditioner or an opened window, weather permitting.  Wash your hands often Wash your hands often and thoroughly with soap and water for at least 20 seconds. You can use an alcohol based hand sanitizer if soap and water are not available and if your hands are not visibly dirty. Avoid touching your eyes, nose, and mouth with unwashed hands. Use disposable paper towels to dry your hands. If not available, use dedicated cloth towels and replace them when they become wet.  Wear a facemask and gloves Wear a disposable facemask at all times in the room and gloves when you touch or have contact with the patients blood, body fluids, and/or secretions or excretions, such as sweat, saliva, sputum, nasal mucus, vomit, urine, or feces.  Ensure the mask fits over your nose and mouth tightly, and do not touch it during use. Throw out disposable facemasks and gloves after using them. Do not reuse. Wash your hands immediately after removing your facemask and gloves. If your personal clothing becomes contaminated, carefully remove clothing and launder. Wash your hands after handling contaminated clothing. Place all used disposable facemasks, gloves, and other waste in a lined container before  disposing them with other household waste. Remove gloves and wash your hands immediately after handling these items.  Do not share dishes, glasses, or other household items with the patient Avoid sharing household items. You should not share dishes, drinking glasses, cups, eating utensils, towels, bedding, or other items with a patient who is confirmed to have, or being evaluated for, COVID-19 infection. After the person uses these items, you should wash them thoroughly with soap and water.  Wash laundry thoroughly Immediately remove and wash clothes or bedding that have blood, body fluids, and/or secretions or excretions, such as sweat, saliva, sputum, nasal mucus, vomit, urine, or feces, on them. Wear gloves when handling laundry from the patient. Read and follow directions on labels of laundry or clothing items and detergent. In general, wash and dry with the warmest temperatures recommended on the label.  Clean all areas the individual has used often Clean all touchable surfaces, such as counters, tabletops, doorknobs, bathroom fixtures, toilets, phones, keyboards, tablets, and bedside tables, every day. Also, clean any surfaces that may have blood, body fluids, and/or secretions or excretions on them. Wear gloves when cleaning surfaces the patient has come in contact with. Use a diluted bleach solution (e.g., dilute bleach with 1 part bleach and 10 parts water) or a household disinfectant with a label that says EPA-registered for coronaviruses. To  make a bleach solution at home, add 1 tablespoon of bleach to 1 quart (4 cups) of water. For a larger supply, add  cup of bleach to 1 gallon (16 cups) of water. Read labels of cleaning products and follow recommendations provided on product labels. Labels contain instructions for safe and effective use of the cleaning product including precautions you should take when applying the product, such as wearing gloves or eye protection and making sure you  have good ventilation during use of the product. Remove gloves and wash hands immediately after cleaning.  Monitor yourself for signs and symptoms of illness Caregivers and household members are considered close contacts, should monitor their health, and will be asked to limit movement outside of the home to the extent possible. Follow the monitoring steps for close contacts listed on the symptom monitoring form.   ? If you have additional questions, contact your local health department or call the epidemiologist on call at [phone] (available 24/7). ? This guidance is subject to change. For the most up-to-date guidance from [facility], please refer to their website: TripMetro.huhttps://www.cdc.gov/coronavirus/2019-ncov/hcp/guidance-prevent-spread.html   You were diagnosed with the flu (influenza).  You will feel ill for as much as a few weeks.  Please take any prescribed medications as instructed, and you may use over-the-counter Tylenol and/or ibuprofen as needed according to label instructions (unless you have an allergy to either or have been told by your doctor not to take them).  Follow up with your physician as instructed above, and return to the Emergency Department (ED) if you are unable to tolerate fluids due to vomiting, have worsening trouble breathing, become extremely tired or difficult to awaken, or if you develop any other symptoms that concern you.

## 2019-02-09 ENCOUNTER — Other Ambulatory Visit: Payer: Self-pay

## 2019-02-09 MED ORDER — LEVONORGESTREL-ETHINYL ESTRAD 0.15-30 MG-MCG PO TABS: ORAL_TABLET | ORAL | 4 refills | Status: DC

## 2019-05-25 ENCOUNTER — Ambulatory Visit: Payer: 59 | Admitting: Certified Nurse Midwife

## 2019-05-25 ENCOUNTER — Other Ambulatory Visit: Payer: Self-pay

## 2019-05-25 ENCOUNTER — Encounter: Payer: Self-pay | Admitting: Certified Nurse Midwife

## 2019-05-25 ENCOUNTER — Ambulatory Visit (INDEPENDENT_AMBULATORY_CARE_PROVIDER_SITE_OTHER): Payer: 59 | Admitting: Certified Nurse Midwife

## 2019-05-25 ENCOUNTER — Other Ambulatory Visit (HOSPITAL_COMMUNITY)
Admission: RE | Admit: 2019-05-25 | Discharge: 2019-05-25 | Disposition: A | Discharge status: Home / Self Care | Payer: 59 | Source: Ambulatory Visit | Attending: Certified Nurse Midwife | Admitting: Certified Nurse Midwife

## 2019-05-25 VITALS — BP 134/78 | HR 74 | Temp 97.1°F | Ht 68.0 in | Wt 300.0 lb

## 2019-05-25 DIAGNOSIS — Z113 Encounter for screening for infections with a predominantly sexual mode of transmission: Secondary | ICD-10-CM | POA: Diagnosis not present

## 2019-05-25 DIAGNOSIS — Z01419 Encounter for gynecological examination (general) (routine) without abnormal findings: Secondary | ICD-10-CM | POA: Diagnosis not present

## 2019-05-25 DIAGNOSIS — Z124 Encounter for screening for malignant neoplasm of cervix: Secondary | ICD-10-CM | POA: Insufficient documentation

## 2019-05-25 DIAGNOSIS — Z3041 Encounter for surveillance of contraceptive pills: Secondary | ICD-10-CM

## 2019-05-25 NOTE — Progress Notes (Signed)
Gynecology Annual Exam  PCP: Patient, No Pcp Per  Chief Complaint:  Chief Complaint  Patient presents with  . Gynecologic Exam    refill bc    History of Present Illness:Valerie Hines is a 28 year old divorced WF,  G1 P0010,  who presents today for her annual gyn exam. She denies any significant gyn problems.  She is sexually active. She uses the OCPs for contraception. She takes Potria continuously x 3 packs before taking placebos. Her menses are irregular. She has occasional spotting but is usually amenorrheic on her pills. She has no dysmenorrhea.   Her most recent pap smear was obtained 05/22/2018 and was NIL and positive for Trichimonas. Hx of Chlamydia Since her last exam 05/22/2018, she has gained 15 pounds (65# in 2 years), but she has quit smoking. Mammogram is not applicable. There is no family history of breast cancer. There is no family history of ovarian cancer. The patient does do occasional self breast exams.  The patient quit smoking this past year. Was smoking 1/2 PPD.  The patient does drink socially.  The patient does not use illegal drugs.  The patient is trying to get back to exercising regularly, both cardio and weight lifting. The patient does not get adequate calcium in her diet. She is lactose intolerant. The patient hs not been taking her calcium supplement She has had a recent cholesterol screen in 2019 and her lipid panel was normal.   The patient denies current symptoms of depression.    Review of Systems: Review of Systems  Constitutional: Negative for chills, fever and weight loss.       Positive for weight gain  HENT: Negative for congestion, sinus pain and sore throat.   Eyes: Negative for blurred vision and pain.  Respiratory: Negative for hemoptysis, shortness of breath and wheezing.   Cardiovascular: Negative for chest pain, palpitations and leg swelling.  Gastrointestinal: Negative for abdominal pain, blood in stool, diarrhea,  heartburn, nausea and vomiting.  Genitourinary: Negative for dysuria, frequency, hematuria and urgency.  Musculoskeletal: Negative for back pain, joint pain and myalgias.  Skin: Negative for itching and rash.  Neurological: Negative for dizziness, tingling and headaches.  Endo/Heme/Allergies: Positive for environmental allergies. Negative for polydipsia. Does not bruise/bleed easily.       Negative for hirsutism   Psychiatric/Behavioral: Negative for depression. The patient is nervous/anxious. The patient does not have insomnia.     Past Medical History:  Past Medical History:  Diagnosis Date  . Chlamydia 2017/ 2018  . Morbid obesity with BMI of 40.0-44.9, adult [facility](H[facility])     Past Surgical History:  Past Surgical History:  Procedure Laterality Date  . INDUCED ABORTION    . TONSILLECTOMY    . WISDOM TOOTH EXTRACTION      Family History:  Family History  Problem Relation Age of Onset  . Thyroid disease Mother   . Thyroid disease Maternal Grandmother   . Hypertension Maternal Grandmother   . Thyroid disease Maternal Grandfather   . Hypertension Maternal Grandfather     Social History:  Social History   Socioeconomic History  . Marital status: Divorced    Spouse name: Not on file  . Number of children: 0  . Years of education: Not on file  . Highest education level: Not on file  Occupational History  . Occupation: Public librarianhift Supervisor  Tobacco Use  . Smoking status: Former Smoker    Packs/day: 0.50    Types: Cigarettes  .  Smokeless tobacco: Never Used  . Tobacco comment: started smoking at age 65, stopped for 3 years  Substance and Sexual Activity  . Alcohol use: Yes    Comment: socially  . Drug use: No  . Sexual activity: Yes    Birth control/protection: Pill  Other Topics Concern  . Not on file  Social History Narrative  . Not on file   Social Determinants of Health   Financial Resource Strain:   . Difficulty of Paying Living Expenses: Not on file  Food  Insecurity:   . Worried About Charity fundraiser in the Last Year: Not on file  . Ran Out of Food in the Last Year: Not on file  Transportation Needs:   . Lack of Transportation (Medical): Not on file  . Lack of Transportation (Non-Medical): Not on file  Physical Activity:   . Days of Exercise per Week: Not on file  . Minutes of Exercise per Session: Not on file  Stress:   . Feeling of Stress : Not on file  Social Connections:   . Frequency of Communication with Friends and Family: Not on file  . Frequency of Social Gatherings with Friends and Family: Not on file  . Attends Religious Services: Not on file  . Active Member of Clubs or Organizations: Not on file  . Attends Archivist Meetings: Not on file  . Marital Status: Not on file  Intimate Partner Violence:   . Fear of Current or Ex-Partner: Not on file  . Emotionally Abused: Not on file  . Physically Abused: Not on file  . Sexually Abused: Not on file    Allergies:  Allergies  Allergen Reactions  . Peanut-Containing Drug Products Anaphylaxis    Other tree nuts and soybeans also    Medication:  Current Outpatient Medications:  .  levonorgestrel-ethinyl estradiol (NORDETTE) 0.15-30 MG-MCG tablet, Take on prne active tablet daily x 3 packs, skipping the placebos in the first two packs, and repeat cycle, Disp: 3 Package, Rfl: 4 Zyrtec .Physical Exam Vitals: BP 134/78   Pulse 74   Temp (!) 97.1 F (36.2 C)   Ht 5\' 8"  (1.727 m)   Wt 300 lb (136.1 kg)   LMP  (LMP Unknown)   BMI 45.61 kg/m .  General: WF in  NAD HEENT: normocephalic, anicteric Neck: no thyroid enlargement, no palpable nodules, no cervical lymphadenopathy  Pulmonary: No increased work of breathing, CTAB Cardiovascular: RRR, without murmur  Breast: Breast symmetrical, no tenderness, no palpable nodules or masses, no skin or nipple retraction present, no nipple discharge.  No axillary, infraclavicular or supraclavicular  lymphadenopathy. Abdomen: Soft, non-tender, non-distended.  Umbilicus without lesions.  No hepatomegaly or masses palpable. No evidence of hernia. Genitourinary:  External: Normal external female genitalia.  Normal urethral meatus, normal Bartholin's and Skene's glands.    Vagina: Normal vaginal mucosa, no evidence of prolapse.    Cervix: Grossly normal in appearance,  non-tender  Uterus: Retroverted, deviated to left; normal size, shape, and consistency, decreased mobility, and non-tender (diificult exam due to body habitus)  Adnexa: No adnexal masses, non-tender  Rectal: deferred  Lymphatic: no evidence of inguinal lymphadenopathy Extremities: no edema, erythema, or tenderness Neurologic: Grossly intact Psychiatric: mood appropriate, affect full     Assessment: 28 y.o. annual gyn exam Weight gain/ Morbid obesity  Plan:   1) Breast cancer screening - recommend monthly self breast exam.   2) STI screening was done.   3) Cervical cancer screening - Pap was done.  ASCCP guidelines and rational discussed.  Patient opts for yearly screening interval  4) Contraception -RX: continuous Portia 3 packs x 4 RF  5) Routine healthcare maintenance including cholesterol and diabetes screening -UTD  6) RTO in 1 year and prn.  Farrel Conners, CNM

## 2019-05-31 LAB — CYTOLOGY - PAP
Chlamydia: NEGATIVE
Comment: NEGATIVE
Comment: NEGATIVE
Comment: NEGATIVE
Comment: NORMAL
Diagnosis: NEGATIVE
High risk HPV: NEGATIVE
Neisseria Gonorrhea: NEGATIVE
Trichomonas: NEGATIVE

## 2020-01-27 ENCOUNTER — Ambulatory Visit
Admission: EM | Admit: 2020-01-27 | Discharge: 2020-01-27 | Disposition: A | Discharge status: Home / Self Care | Payer: 59 | Attending: Emergency Medicine | Admitting: Emergency Medicine

## 2020-01-27 ENCOUNTER — Other Ambulatory Visit: Payer: Self-pay

## 2020-01-27 DIAGNOSIS — W5503XA Scratched by cat, initial encounter: Secondary | ICD-10-CM

## 2020-01-27 DIAGNOSIS — S60519A Abrasion of unspecified hand, initial encounter: Secondary | ICD-10-CM | POA: Diagnosis not present

## 2020-01-27 DIAGNOSIS — L03114 Cellulitis of left upper limb: Secondary | ICD-10-CM

## 2020-01-27 MED ORDER — AZITHROMYCIN 250 MG PO TABS: 250.0000 mg | ORAL_TABLET | Freq: Every day | ORAL | 0 refills | Status: AC

## 2020-01-27 NOTE — ED Triage Notes (Signed)
Patient states that yesterday morning her cat was sleeping on her and the other cat startled her and her paw went in to the patients left hand. Patient hand is swollen and red with several puncture marks.

## 2020-01-27 NOTE — ED Provider Notes (Addendum)
Valerie Hines    CSN: [id number] Arrival date & time: 01/27/20  1043      History   Chief Complaint Chief Complaint  Patient presents with  . Abrasion    HPI Valerie Hines is a 29 y.o. female.   Patient presents with 3 puncture wounds in her left hand.  These occurred yesterday morning when her cat was startled and his rear claws dug into her hand.  She cleaned the wounds out at home but states the area has gotten more swollen and red today.  No drainage from the wounds.  She denies numbness, weakness, fever, chills, arthralgias, chest pain, shortness of breath, abdominal pain, or other symptoms.  Patient reports the cat is up-to-date on her rabies vaccination.  Patient's tetanus is <5 years.   The history is provided by the patient.    Past Medical History:  Diagnosis Date  . Chlamydia 2017/ 2018  . Morbid obesity with BMI of 40.0-44.9, adult [facility])     Patient Active Problem List   Diagnosis Date Noted  . Tobacco use disorder 05/12/2017  . Morbid obesity with BMI of 40.0-44.9, adult [facility])     Past Surgical History:  Procedure Laterality Date  . INDUCED ABORTION    . TONSILLECTOMY    . WISDOM TOOTH EXTRACTION      OB History    Gravida  1   Para  0   Term      Preterm      AB  1   Living        SAB      TAB  1   Ectopic      Multiple      Live Births               Home Medications    Prior to Admission medications   Medication Sig Start Date End Date Taking? Authorizing Provider  levonorgestrel-ethinyl estradiol (NORDETTE) 0.15-30 MG-MCG tablet Take one active tablet daily x 3 packs, skipping the placebos in the first two packs, and repeat cycle 02/09/19  Yes Farrel Conners, CNM  albuterol (PROVENTIL HFA;VENTOLIN HFA) 108 (90 Base) MCG/ACT inhaler Inhale 2 puffs into the lungs every 6 (six) hours as needed for wheezing or shortness of breath. 09/16/18   Sharyn Creamer, MD  azithromycin (ZITHROMAX) 250 MG tablet Take 1 tablet (250  mg total) by mouth daily. Take first 2 tablets together, then 1 every day until finished. 01/27/20   Mickie Bail, NP    Family History Family History  Problem Relation Age of Onset  . Thyroid disease Mother   . Thyroid disease Maternal Grandmother   . Hypertension Maternal Grandmother   . Thyroid disease Maternal Grandfather   . Hypertension Maternal Grandfather     Social History Social History   Tobacco Use  . Smoking status: Former Smoker    Packs/day: 0.50    Types: Cigarettes  . Smokeless tobacco: Never Used  . Tobacco comment: started smoking at age 15, stopped for 3 years  Vaping Use  . Vaping Use: Former  Substance Use Topics  . Alcohol use: Yes    Comment: socially  . Drug use: No     Allergies   Peanut-containing drug products   Review of Systems Review of Systems  Constitutional: Negative for chills and fever.  HENT: Negative for ear pain and sore throat.   Eyes: Negative for pain and visual disturbance.  Respiratory: Negative for cough and shortness of breath.  Cardiovascular: Negative for chest pain and palpitations.  Gastrointestinal: Negative for abdominal pain and vomiting.  Genitourinary: Negative for dysuria and hematuria.  Musculoskeletal: Negative for arthralgias and back pain.  Skin: Positive for color change and wound. Negative for rash.  Neurological: Negative for seizures and syncope.  All other systems reviewed and are negative.    Physical Exam Triage Vital Signs ED Triage Vitals  Enc Vitals Group     BP      Pulse      Resp      Temp      Temp src      SpO2      Weight      Height      Head Circumference      Peak Flow      Pain Score      Pain Loc      Pain Edu?      Excl. in GC?    No data found.  Updated Vital Signs BP (!) 142/88 (BP Location: Right Arm)   Pulse 92   Temp 99 F (37.2 C) (Oral)   Resp 17   Ht 5\' 8"  (1.727 m)   Wt (!) 302 lb (137 kg)   SpO2 97%   BMI 45.92 kg/m   Visual Acuity Right Eye  Distance:   Left Eye Distance:   Bilateral Distance:    Right Eye Near:   Left Eye Near:    Bilateral Near:     Physical Exam Vitals and nursing note reviewed.  Constitutional:      General: She is not in acute distress.    Appearance: She is well-developed. She is not ill-appearing.  HENT:     Head: Normocephalic and atraumatic.     Mouth/Throat:     Mouth: Mucous membranes are moist.  Eyes:     Conjunctiva/sclera: Conjunctivae normal.  Cardiovascular:     Rate and Rhythm: Normal rate and regular rhythm.     Heart sounds: No murmur heard.   Pulmonary:     Effort: Pulmonary effort is normal. No respiratory distress.     Breath sounds: Normal breath sounds.  Abdominal:     Palpations: Abdomen is soft.     Tenderness: There is no abdominal tenderness.  Musculoskeletal:        General: Swelling present.     Cervical back: Neck supple.  Skin:    General: Skin is warm and dry.     Findings: Erythema and lesion present.     Comments: Three puncture wound on back on left hand; localized erythema and swelling. No drainage or streaks.    Neurological:     General: No focal deficit present.     Mental Status: She is alert and oriented to person, place, and time.     Gait: Gait normal.  Psychiatric:        Mood and Affect: Mood normal.        Behavior: Behavior normal.        UC Treatments / Results  Labs (all labs ordered are listed, but only abnormal results are displayed) Labs Reviewed - No data to display  EKG   Radiology No results found.  Procedures Procedures (including critical care time)  Medications Ordered in UC Medications - No data to display  Initial Impression / Assessment and Plan / UC Course  I have reviewed the triage vital signs and the nursing notes.  Pertinent labs & imaging results that were available during my care  of the patient were reviewed by me and considered in my medical decision making (see chart for details).   Cat scratch of  left hand with cellulitis.  It was the patient's own cat who scratched her and she reports the cat is up-to-date on rabies.  Treating with Zithromax.  Wound care instructions and signs of increased infection discussed with patient at length.  Instructed her to go to the ED if she notes signs of worsening infection.  Patient agrees to plan of care.   Final Clinical Impressions(s) / UC Diagnoses   Final diagnoses:  Cat scratch of hand, initial encounter  Cellulitis of hand, left     Discharge Instructions     Take the antibiotic as directed.  Keep the area clean and dry.  Wash it twice a day with soap and water, then apply an antibiotic ointment and bandage.    Go to the emergency department right away if you develop fever, chills, increased redness, increased swelling, increased pain, or other concerning symptoms.        ED Prescriptions    Medication Sig Dispense Auth. Provider   azithromycin (ZITHROMAX) 250 MG tablet Take 1 tablet (250 mg total) by mouth daily. Take first 2 tablets together, then 1 every day until finished. 6 tablet Mickie Bail, NP     PDMP not reviewed this encounter.   Mickie Bail, NP 01/27/20 1119    Mickie Bail, NP 01/27/20 [phone]

## 2020-01-27 NOTE — Discharge Instructions (Addendum)
Take the antibiotic as directed.  Keep the area clean and dry.  Wash it twice a day with soap and water, then apply an antibiotic ointment and bandage.    Go to the emergency department right away if you develop fever, chills, increased redness, increased swelling, increased pain, or other concerning symptoms.

## 2020-02-15 ENCOUNTER — Other Ambulatory Visit: Payer: Self-pay

## 2020-02-15 MED ORDER — LEVONORGESTREL-ETHINYL ESTRAD 0.15-30 MG-MCG PO TABS: ORAL_TABLET | ORAL | 0 refills | Status: DC

## 2020-02-15 NOTE — Telephone Encounter (Signed)
Pt calling; has scheduled annual for 05/25/20; needs refill on bc until then.  [phone]  Pt aware refill eRx'd.

## 2020-04-21 ENCOUNTER — Other Ambulatory Visit: Payer: Self-pay

## 2020-04-21 DIAGNOSIS — Z3041 Encounter for surveillance of contraceptive pills: Secondary | ICD-10-CM

## 2020-04-21 MED ORDER — LEVONORGESTREL-ETHINYL ESTRAD 0.15-30 MG-MCG PO TABS: ORAL_TABLET | ORAL | 0 refills | Status: DC

## 2020-04-21 NOTE — Telephone Encounter (Signed)
1 mo rf sent. Pt aware.

## 2020-04-21 NOTE — Telephone Encounter (Signed)
Patient schedule apt for AE 05/2020. Will run out of birth control next week. Requesting refill. Cb#[phone]

## 2020-05-22 ENCOUNTER — Telehealth: Payer: Self-pay

## 2020-05-22 DIAGNOSIS — Z3041 Encounter for surveillance of contraceptive pills: Secondary | ICD-10-CM

## 2020-05-22 MED ORDER — LEVONORGESTREL-ETHINYL ESTRAD 0.15-30 MG-MCG PO TABS: ORAL_TABLET | ORAL | 0 refills | Status: DC

## 2020-05-22 NOTE — Telephone Encounter (Signed)
1 rf sent.

## 2020-05-22 NOTE — Telephone Encounter (Addendum)
Patient finished pack of OCP last night. AE is scheduled for Thursday 05/25/20. Needs additional refill to start tonight. Cb#[phone]

## 2020-05-22 NOTE — Telephone Encounter (Signed)
LMVM to notify pt. 

## 2020-05-25 ENCOUNTER — Encounter: Payer: Self-pay | Admitting: Obstetrics

## 2020-05-25 ENCOUNTER — Ambulatory Visit (INDEPENDENT_AMBULATORY_CARE_PROVIDER_SITE_OTHER): Payer: 59 | Admitting: Obstetrics

## 2020-05-25 ENCOUNTER — Other Ambulatory Visit (HOSPITAL_COMMUNITY)
Admission: RE | Admit: 2020-05-25 | Discharge: 2020-05-25 | Disposition: A | Discharge status: Home / Self Care | Payer: 59 | Source: Ambulatory Visit | Attending: Obstetrics | Admitting: Obstetrics

## 2020-05-25 ENCOUNTER — Other Ambulatory Visit: Payer: Self-pay

## 2020-05-25 VITALS — BP 126/74 | HR 84

## 2020-05-25 DIAGNOSIS — Z01419 Encounter for gynecological examination (general) (routine) without abnormal findings: Secondary | ICD-10-CM | POA: Insufficient documentation

## 2020-05-25 DIAGNOSIS — Z124 Encounter for screening for malignant neoplasm of cervix: Secondary | ICD-10-CM | POA: Insufficient documentation

## 2020-05-25 DIAGNOSIS — F17200 Nicotine dependence, unspecified, uncomplicated: Secondary | ICD-10-CM

## 2020-05-25 DIAGNOSIS — Z3041 Encounter for surveillance of contraceptive pills: Secondary | ICD-10-CM | POA: Diagnosis not present

## 2020-05-25 DIAGNOSIS — Z113 Encounter for screening for infections with a predominantly sexual mode of transmission: Secondary | ICD-10-CM | POA: Diagnosis not present

## 2020-05-25 MED ORDER — LEVONORGESTREL-ETHINYL ESTRAD 0.15-30 MG-MCG PO TABS: ORAL_TABLET | ORAL | 3 refills | Status: DC

## 2020-05-25 NOTE — Progress Notes (Signed)
. Gynecology Annual Exam  PCP: Patient, No Pcp Per  Chief Complaint:  Chief Complaint  Patient presents with  . Annual Exam    Needs bc refilled for a year. Ref for primary care physician-adv to ck and see who is in network c her ins and a few names given.    History of Present Illness [name] is a 29 y.o. G1P0010 who LMP was No LMP recorded (lmp unknown). (Menstrual status: Oral contraceptives)., presents today for her annual examination.  Her menses are absent due to continuous use of her hormonal contraceptive.  She does not have vasomotor sx. She uses multivitaminsand her OCPs  For her meds.  She is single partner, contraception - OCP (estrogen/progesterone). She does not have vaginal dryness.  Last Pap: May 25, 2019  Results were: no abnormalities /neg HPV DNA.  Hx of STDs: chlamydia, trichomonas   There is no FH of breast cancer. There is no FH of ovarian cancer. The patient does do self-breast exams.  Colonoscopy: not due DEXA: not due  Tobacco use: she quit  in April of 2020. Alcohol use: social drinker Exercise: not active  She does get adequate calcium and Vitamin D in her diet.   The patient is sexually active. She currently uses Oral contraceptives: Present: yes for contraception. The patient wears seatbelts: yes.   last pap: was normal  The patient has regular exercise: no.  The patient has ever been transfused or tattooed?: yes.  The patient reports that domestic violence in her life is absent.    Review of Systems: Review of Systems  Constitutional: Negative.   Eyes: Negative.   Respiratory: Negative.   Cardiovascular: Negative.   Genitourinary: Negative.   Musculoskeletal: Negative.   Skin: Negative.   Neurological: Negative.   Endo/Heme/Allergies: Negative.   Psychiatric/Behavioral: Negative.      Past Medical History:  Past Medical History:  Diagnosis Date  . Chlamydia 2017/ 2018  . Morbid obesity with BMI of 40.0-44.9, adult  (HCC)   . Tobacco use disorder 05/12/2017    Past Surgical History:  Past Surgical History:  Procedure Laterality Date  . INDUCED ABORTION    . TONSILLECTOMY    . WISDOM TOOTH EXTRACTION      Medications: Prior to Admission medications   Medication Sig Start Date End Date Taking? Authorizing Provider  levonorgestrel-ethinyl estradiol (NORDETTE) 0.15-30 MG-MCG tablet Take one active tablet daily x 3 packs, skipping the placebos in the first two packs, and repeat cycle 05/25/20  Yes Mirna Mires, CNM  albuterol (PROVENTIL HFA;VENTOLIN HFA) 108 (90 Base) MCG/ACT inhaler Inhale 2 puffs into the lungs every 6 (six) hours as needed for wheezing or shortness of breath. Patient not taking: Reported on 05/25/2020 09/16/18   Sharyn Creamer, MD  azithromycin (ZITHROMAX) 250 MG tablet Take 1 tablet (250 mg total) by mouth daily. Take first 2 tablets together, then 1 every day until finished. Patient not taking: Reported on 05/25/2020 01/27/20   Mickie Bail, NP    Allergies:  Allergies  Allergen Reactions  . Peanut-Containing Drug Products Anaphylaxis    Other tree nuts and soybeans also    Gynecologic History: No LMP recorded (lmp unknown). (Menstrual status: Oral contraceptives).  Obstetric History: G1P0010  Social History:  Social History   Socioeconomic History  . Marital status: Divorced    Spouse name: Not on file  . Number of children: 0  . Years of education: Not on file  . Highest education level: Not  on file  Occupational History  . Occupation: Public librarian  Tobacco Use  . Smoking status: Former Smoker    Packs/day: 0.50    Types: Cigarettes  . Smokeless tobacco: Never Used  . Tobacco comment: started smoking at age 89, stopped for 3 years  Vaping Use  . Vaping Use: Some days  Substance and Sexual Activity  . Alcohol use: Yes    Comment: socially  . Drug use: No  . Sexual activity: Yes    Birth control/protection: Pill  Other Topics Concern  . Not on file   Social History Narrative  . Not on file   Social Determinants of Health   Financial Resource Strain: Not on file  Food Insecurity: Not on file  Transportation Needs: Not on file  Physical Activity: Not on file  Stress: Not on file  Social Connections: Not on file  Intimate Partner Violence: Not on file    Family History:  Family History  Problem Relation Age of Onset  . Thyroid disease Mother   . Thyroid disease Maternal Grandmother   . Hypertension Maternal Grandmother   . Thyroid disease Maternal Grandfather   . Hypertension Maternal Grandfather      Physical Exam Vitals:  Vitals:   05/25/20 1526  BP: 126/74  Pulse: 84   Physical Exam Constitutional:      Appearance: Normal appearance. She is obese.  HENT:     Head: Normocephalic and atraumatic.     Nose: Nose normal.  Eyes:     Extraocular Movements: Extraocular movements intact.     Pupils: Pupils are equal, round, and reactive to light.  Cardiovascular:     Rate and Rhythm: Normal rate and regular rhythm.     Pulses: Normal pulses.     Heart sounds: Normal heart sounds.  Pulmonary:     Effort: Pulmonary effort is normal.     Breath sounds: Normal breath sounds.  Abdominal:     General: Bowel sounds are normal.     Palpations: Abdomen is soft.     Comments: Adipose.  Genitourinary:    General: Normal vulva.     Comments: Normal female anatomy, no rashes, lesions, irritation or enlarged glands. Vaginal walls well rugaed, scant discharge noted. Cervis is nulliparous. Retroverted uterus, midline, non enlarged. Musculoskeletal:        General: Normal range of motion.     Cervical back: Normal range of motion and neck supple.  Skin:    General: Skin is warm and dry.     Comments: Several tattoos.  Neurological:     General: No focal deficit present.     Mental Status: She is alert and oriented to person, place, and time.  Psychiatric:        Mood and Affect: Mood normal.        Behavior: Behavior  normal.      Assessment: 29 y.o. G1P0010 well woman exam STI screening desired Morbid obesity Plan:  1) Contraception Education given regarding options for contraception, including oral contraceptives.Her prescription is renewed.  2) STI screening was offered and done, except for blood work.  3) Pap done  4) Routine healthcare maintenance including cholesterol, diabetes screening not done today. Will be done by her PCP  5) Follow up 1 year for routine annual exam  1. Women's annual routine gynecological examination  - Cytology - PAP - Cervicovaginal ancillary only  2. Cervical cancer screening  - Cytology - PAP  3. Routine screening for STI (sexually transmitted infection)  -  Cervicovaginal ancillary only  4. Encounter for surveillance of contraceptive pills  - levonorgestrel-ethinyl estradiol (NORDETTE) 0.15-30 MG-MCG tablet; Take one active tablet daily x 3 packs, skipping the placebos in the first two packs, and repeat cycle  Dispense: 84 tablet; Refill: 3  5. Tobacco use disorder- she has quit!  Mirna Mires, CNM  05/25/2020 5:49 PM       Mirna Mires, CNM  05/25/2020 5:30 PM

## 2020-05-29 ENCOUNTER — Encounter: Payer: Self-pay | Admitting: Obstetrics

## 2020-05-29 LAB — CERVICOVAGINAL ANCILLARY ONLY
Bacterial Vaginitis (gardnerella): NEGATIVE
Chlamydia: NEGATIVE
Comment: NEGATIVE
Comment: NEGATIVE
Comment: NEGATIVE
Comment: NORMAL
Neisseria Gonorrhea: NEGATIVE
Trichomonas: NEGATIVE

## 2020-05-29 LAB — CYTOLOGY - PAP
Comment: NEGATIVE
Comment: NEGATIVE
Diagnosis: NEGATIVE
High risk HPV: NEGATIVE
Trichomonas: NEGATIVE

## 2020-10-02 ENCOUNTER — Emergency Department: Payer: 59

## 2020-10-02 ENCOUNTER — Other Ambulatory Visit: Payer: Self-pay

## 2020-10-02 ENCOUNTER — Emergency Department
Admission: EM | Admit: 2020-10-02 | Discharge: 2020-10-02 | Disposition: A | Discharge status: Home / Self Care | Payer: 59 | Attending: Emergency Medicine | Admitting: Emergency Medicine

## 2020-10-02 DIAGNOSIS — Z87891 Personal history of nicotine dependence: Secondary | ICD-10-CM | POA: Diagnosis not present

## 2020-10-02 DIAGNOSIS — E860 Dehydration: Secondary | ICD-10-CM

## 2020-10-02 DIAGNOSIS — R059 Cough, unspecified: Secondary | ICD-10-CM | POA: Diagnosis present

## 2020-10-02 DIAGNOSIS — U071 COVID-19: Secondary | ICD-10-CM | POA: Insufficient documentation

## 2020-10-02 DIAGNOSIS — Z91010 Allergy to peanuts: Secondary | ICD-10-CM | POA: Insufficient documentation

## 2020-10-02 DIAGNOSIS — R000 Tachycardia, unspecified: Secondary | ICD-10-CM | POA: Insufficient documentation

## 2020-10-02 LAB — PROCALCITONIN: Procalcitonin: <0.1 ng/mL

## 2020-10-02 LAB — CBC WITH DIFFERENTIAL/PLATELET
Abs Immature Granulocytes: 0.02 10*3/uL (ref 0.00–0.07)
Basophils Absolute: 0 10*3/uL (ref 0.0–0.1)
Basophils Relative: 0 %
Eosinophils Absolute: 0.2 10*3/uL (ref 0.0–0.5)
Eosinophils Relative: 3 %
HCT: 39.3 % (ref 36.0–46.0)
Hemoglobin: 14.1 g/dL (ref 12.0–15.0)
Immature Granulocytes: 0 %
Lymphocytes Relative: 26 %
Lymphs Abs: 1.8 10*3/uL (ref 0.7–4.0)
MCH: 33.6 pg (ref 26.0–34.0)
MCHC: 35.9 g/dL (ref 30.0–36.0)
MCV: 93.6 fL (ref 80.0–100.0)
Monocytes Absolute: 0.6 10*3/uL (ref 0.1–1.0)
Monocytes Relative: 8 %
Neutro Abs: 4.2 10*3/uL (ref 1.7–7.7)
Neutrophils Relative %: 63 %
Platelets: 236 10*3/uL (ref 150–400)
RBC: 4.2 MIL/uL (ref 3.87–5.11)
RDW: 13.6 % (ref 11.5–15.5)
WBC: 6.8 10*3/uL (ref 4.0–10.5)
nRBC: 0 % (ref 0.0–0.2)

## 2020-10-02 LAB — D-DIMER, QUANTITATIVE: D-Dimer, Quant: 0.44 ug/mL-FEU (ref 0.00–0.50)

## 2020-10-02 LAB — BASIC METABOLIC PANEL
Anion gap: 12 (ref 5–15)
BUN: 12 mg/dL (ref 6–20)
CO2: 21 mmol/L — ABNORMAL LOW (ref 22–32)
Calcium: 9 mg/dL (ref 8.9–10.3)
Chloride: 105 mmol/L (ref 98–111)
Creatinine, Ser: 0.61 mg/dL (ref 0.44–1.00)
GFR, Estimated: >60 mL/min (ref >60)
Glucose, Bld: 96 mg/dL (ref 70–99)
Potassium: 3.6 mmol/L (ref 3.5–5.1)
Sodium: 138 mmol/L (ref 135–145)

## 2020-10-02 LAB — HCG, QUANTITATIVE, PREGNANCY: hCG, Beta Chain, Quant, S: <1 m[IU]/mL (ref <5)

## 2020-10-02 LAB — TROPONIN I (HIGH SENSITIVITY): Troponin I (High Sensitivity): <2 ng/L (ref <18)

## 2020-10-02 MED ORDER — ONDANSETRON HCL 4 MG/2ML IJ SOLN
4.0000 mg | Freq: Once | INTRAMUSCULAR | Status: AC
  Administered 2020-10-02: 4 mg via INTRAVENOUS
  Filled 2020-10-02: qty 2

## 2020-10-02 MED ORDER — KETOROLAC TROMETHAMINE 30 MG/ML IJ SOLN
30.0000 mg | Freq: Once | INTRAMUSCULAR | Status: AC
  Administered 2020-10-02: 30 mg via INTRAVENOUS
  Filled 2020-10-02: qty 1

## 2020-10-02 MED ORDER — ACETAMINOPHEN 500 MG PO TABS
1000.0000 mg | ORAL_TABLET | Freq: Once | ORAL | Status: AC
  Administered 2020-10-02: 1000 mg via ORAL
  Filled 2020-10-02: qty 2

## 2020-10-02 MED ORDER — BENZONATATE 100 MG PO CAPS
100.0000 mg | ORAL_CAPSULE | Freq: Once | ORAL | Status: AC
  Administered 2020-10-02: 100 mg via ORAL
  Filled 2020-10-02: qty 1

## 2020-10-02 MED ORDER — LACTATED RINGERS IV BOLUS
1000.0000 mL | Freq: Once | INTRAVENOUS | Status: AC
  Administered 2020-10-02: 1000 mL via INTRAVENOUS

## 2020-10-02 MED ORDER — ONDANSETRON 4 MG PO TBDP: 4.0000 mg | ORAL_TABLET | Freq: Three times a day (TID) | ORAL | 0 refills | Status: AC | PRN

## 2020-10-02 NOTE — ED Notes (Signed)
Pt tolerated popsicle well.  

## 2020-10-02 NOTE — ED Notes (Signed)
Pt states still can't drink fluids but will try to drink gingerale

## 2020-10-02 NOTE — ED Triage Notes (Signed)
Pt comes with c/o COVID+ that dx last night by home test. Pt states the symptoms started Saturday and she thought it was jsut her allergies.  Pt states productive cough, congestion and sore throat.

## 2020-10-02 NOTE — ED Provider Notes (Signed)
Reevaluated patient, she still feeling nauseated but agrees with discharge home for supportive care, will Rx Zofran ODT   Jene Every, MD 10/02/20 2059

## 2020-10-02 NOTE — ED Notes (Signed)
Pt reports home positive covid test yesterday.  Sx for 3-4 days.  Pt has a cough.  Using an inhaler with some relief.  Pt also taking mucinex without relief.  Pt alert  Speech clear.

## 2020-10-02 NOTE — ED Provider Notes (Signed)
[facility] Emergency Department Provider Note  ____________________________________________   Event Date/Time   First MD Initiated Contact with Patient 10/02/20 1833     (approximate)  I have reviewed the triage vital signs and the nursing notes.   HISTORY  Chief Complaint COVID+   HPI Valerie Hines is a 30 y.o. female with a past medical history of obesity previous tobacco abuse who presents for assessment approximately 3 to 4 days of congestion, cough, sore throat, myalgias, chills, subjective fevers, vomiting with some blood streaks mixed in, and general malaise.  She states that she was exposed to people with COVID and had a positive home COVID test yesterday.  He states she initially thought her symptoms were allergies but now thinks they are all related to COVID after L got much worse.  She denies any abdominal pain or focal back pain, rash, focal extremity pain, burning with urination, diarrhea or recent injuries or falls.  No specific earache eye pain elation versus mild headache.  She has been vaccinated.  She has been taking over-the-counter cold medicines which have not helped much.  No other acute concerns at this time.         Past Medical History:  Diagnosis Date  . Chlamydia 2017/ 2018  . Morbid obesity with BMI of 40.0-44.9, adult (HCC)   . Tobacco use disorder 05/12/2017    Patient Active Problem List   Diagnosis Date Noted  . Morbid obesity with BMI of 40.0-44.9, adult [facility])     Past Surgical History:  Procedure Laterality Date  . INDUCED ABORTION    . TONSILLECTOMY    . WISDOM TOOTH EXTRACTION      Prior to Admission medications   Medication Sig Start Date End Date Taking? Authorizing Provider  albuterol (PROVENTIL HFA;VENTOLIN HFA) 108 (90 Base) MCG/ACT inhaler Inhale 2 puffs into the lungs every 6 (six) hours as needed for wheezing or shortness of breath. Patient not taking: Reported on 05/25/2020 09/16/18   Sharyn Creamer,  MD  azithromycin (ZITHROMAX) 250 MG tablet Take 1 tablet (250 mg total) by mouth daily. Take first 2 tablets together, then 1 every day until finished. Patient not taking: Reported on 05/25/2020 01/27/20   Mickie Bail, NP  levonorgestrel-ethinyl estradiol (NORDETTE) 0.15-30 MG-MCG tablet Take one active tablet daily x 3 packs, skipping the placebos in the first two packs, and repeat cycle 05/25/20   Mirna Mires, CNM    Allergies Peanut-containing drug products  Family History  Problem Relation Age of Onset  . Thyroid disease Mother   . Thyroid disease Maternal Grandmother   . Hypertension Maternal Grandmother   . Thyroid disease Maternal Grandfather   . Hypertension Maternal Grandfather     Social History Social History   Tobacco Use  . Smoking status: Former Smoker    Packs/day: 0.50    Types: Cigarettes  . Smokeless tobacco: Never Used  . Tobacco comment: started smoking at age 51, stopped for 3 years  Vaping Use  . Vaping Use: Some days  Substance Use Topics  . Alcohol use: Yes    Comment: socially  . Drug use: No    Review of Systems  Review of Systems  Constitutional: Positive for chills, fever and malaise/fatigue.  HENT: Positive for congestion and sore throat.   Eyes: Negative for pain.  Respiratory: Positive for cough and shortness of breath. Negative for stridor.   Cardiovascular: Positive for chest pain.  Gastrointestinal: Positive for nausea and vomiting.  Genitourinary:  Negative for dysuria.  Musculoskeletal: Positive for myalgias.  Skin: Negative for rash.  Neurological: Positive for headaches. Negative for seizures and loss of consciousness.  Psychiatric/Behavioral: Negative for suicidal ideas.  All other systems reviewed and are negative.     ____________________________________________   PHYSICAL EXAM:  VITAL SIGNS: ED Triage Vitals  Enc Vitals Group     BP 10/02/20 1831 129/80     Pulse Rate 10/02/20 1831 (!) 119     Resp 10/02/20  1831 20     Temp 10/02/20 1831 99.4 F (37.4 C)     Temp Source 10/02/20 1831 Oral     SpO2 10/02/20 1831 97 %     Weight --      Height --      Head Circumference --      Peak Flow --      Pain Score 10/02/20 1830 5     Pain Loc --      Pain Edu? --      Excl. in GC? --    Vitals:   10/02/20 1831  BP: 129/80  Pulse: (!) 119  Resp: 20  Temp: 99.4 F (37.4 C)  SpO2: 97%   Physical Exam Vitals and nursing note reviewed.  Constitutional:      General: She is not in acute distress.    Appearance: She is well-developed. She is ill-appearing.  HENT:     Head: Normocephalic and atraumatic.     Right Ear: External ear normal.     Left Ear: External ear normal.     Nose: Nose normal.     Mouth/Throat:     Mouth: Mucous membranes are dry.  Eyes:     Conjunctiva/sclera: Conjunctivae normal.  Cardiovascular:     Rate and Rhythm: Regular rhythm. Tachycardia present.     Heart sounds: No murmur heard.   Pulmonary:     Effort: Pulmonary effort is normal. No respiratory distress.     Breath sounds: Normal breath sounds.  Abdominal:     Palpations: Abdomen is soft.     Tenderness: There is no abdominal tenderness.  Musculoskeletal:     Cervical back: Neck supple.  Skin:    General: Skin is warm and dry.     Capillary Refill: Capillary refill takes more than 3 seconds.  Neurological:     Mental Status: She is alert and oriented to person, place, and time.  Psychiatric:        Mood and Affect: Mood normal.      ____________________________________________   LABS (all labs ordered are listed, but only abnormal results are displayed)  Labs Reviewed  BASIC METABOLIC PANEL - Abnormal; Notable for the following components:      Result Value   CO2 21 (*)    All other components within normal limits  D-DIMER, QUANTITATIVE  CBC WITH DIFFERENTIAL/PLATELET  HCG, QUANTITATIVE, PREGNANCY  PROCALCITONIN  TROPONIN I (HIGH SENSITIVITY)  TROPONIN I (HIGH SENSITIVITY)    ____________________________________________  EKG  Sinus tachycardia with ventricular rate of 108, normal axis, unremarkable intervals and some nonspecific ST changes versus artifact in anterior lateral and inferior leads. ____________________________________________  RADIOLOGY  ED MD interpretation: No clear for consolidation, effusion, pneumothorax or other clear acute thoracic abnormality  Official radiology report(s): DG Chest Portable 1 View  Result Date: 10/02/2020 CLINICAL DATA:  Cough.  Positive COVID test yesterday. EXAM: PORTABLE CHEST 1 VIEW COMPARISON:  09/16/2018 FINDINGS: The heart size and mediastinal contours are within normal limits. Both lungs are clear.  The visualized skeletal structures are unremarkable. IMPRESSION: No active disease. Electronically Signed   By: Burman Nieves M.D.   On: 10/02/2020 19:41    ____________________________________________   PROCEDURES  Procedure(s) performed (including Critical Care):  .1-3 Lead EKG Interpretation Performed by: Gilles Chiquito, MD Authorized by: Gilles Chiquito, MD     Interpretation: normal     ECG rate assessment: tachycardic     Rhythm: sinus tachycardia     Ectopy: none     Conduction: normal       ____________________________________________   INITIAL IMPRESSION / ASSESSMENT AND PLAN / ED COURSE      Patient presents with above-stated history exam for assessment of some chest tightness, cough, shortness of breath, headache, myalgias, fevers, chills, malaise, decreased appetite and some nausea and vomiting with some blood streaks in her emesis.  On arrival she is tachycardic with otherwise stable vital signs on room air.  Lungs are clear bilaterally but she does have a evidence of dehydration with dry mucous membranes and decreased capillary refill.  Suspect symptoms are likely related to acute COVID infection with component of gastritis and dehydration from GI volume losses and decreased  intake.  Additional differential includes PE, bacterial pneumonia, symptomatic arrhythmia, symptomatic anemia, metabolic derangements, myocarditis and ACS.  While ECG has nonspecific findings given troponin undetectable obtained greater than 3 hours after symptom onset I have a low suspicion for patient for ACS or myocarditis.  D-dimer is less than 0.5 and this Evalose patient for PE at this time.  Patient has no fever or for consolidation on chest x-ray or leukocytosis to suggest acute bacterial pneumonia at this time.  CBC is unremarkable and has no evidence of acute anemia.  BMP has no evidence of significant electrolyte or metabolic derangements.  Pregnancy test is negative.  Procalcitonin is undetectable.  Patient given IV fluids below noted antiemetics and analgesia.  Care patient signed over to Dr. Peter Minium at approximately 8:45 PM.  Plan is to reassess and see if patient can tolerate p.o. and if she is able to likely discharge with outpatient follow-up.    ____________________________________________   FINAL CLINICAL IMPRESSION(S) / ED DIAGNOSES  Final diagnoses:  COVID  Dehydration    Medications  lactated ringers bolus 1,000 mL (1,000 mLs Intravenous New Bag/Given 10/02/20 1911)  ondansetron (ZOFRAN) injection 4 mg (4 mg Intravenous Given 10/02/20 1913)  ketorolac (TORADOL) 30 MG/ML injection 30 mg (30 mg Intravenous Given 10/02/20 2034)  acetaminophen (TYLENOL) tablet 1,000 mg (1,000 mg Oral Given 10/02/20 2034)  ondansetron (ZOFRAN) injection 4 mg (4 mg Intravenous Given 10/02/20 2034)  benzonatate (TESSALON) capsule 100 mg (100 mg Oral Given 10/02/20 2034)     ED Discharge Orders         Ordered    Ambulatory referral for Covid Treatment        10/02/20 2047           Note:  This document was prepared using Dragon voice recognition software and may include unintentional dictation errors.   Gilles Chiquito, MD 10/02/20 [phone]

## 2020-10-02 NOTE — ED Notes (Signed)
Report off to laura rn  

## 2020-10-03 ENCOUNTER — Telehealth (HOSPITAL_COMMUNITY): Payer: Self-pay

## 2020-10-03 NOTE — Telephone Encounter (Signed)
Called to Discuss with patient about Covid symptoms and the treatment options for those with mild to moderate Covid symptoms and at a high risk of hospitalization.     Pt is qualified for this infusion due to co-morbid conditions and/or a member of an at-risk group.     Patient Active Problem List   Diagnosis Date Noted  . Morbid obesity with BMI of 40.0-44.9, adult [facility])     Patient declines infusion at this time. Symptoms tier reviewed as well as criteria for ending isolation. Preventative practices reviewed. Patient verbalized understanding.    Patient advised to call back if he/she decides that he/she does want to get infusion. Callback number to the infusion center given. Patient advised to go to Urgent care or ED with severe symptoms.   Essie Hart, RN

## 2020-12-04 ENCOUNTER — Other Ambulatory Visit: Payer: Self-pay

## 2020-12-04 ENCOUNTER — Encounter: Payer: Self-pay | Admitting: Emergency Medicine

## 2020-12-04 ENCOUNTER — Emergency Department
Admission: EM | Admit: 2020-12-04 | Discharge: 2020-12-05 | Disposition: A | Discharge status: Home / Self Care | Payer: 59 | Attending: Emergency Medicine | Admitting: Emergency Medicine

## 2020-12-04 DIAGNOSIS — Z87891 Personal history of nicotine dependence: Secondary | ICD-10-CM | POA: Insufficient documentation

## 2020-12-04 DIAGNOSIS — R202 Paresthesia of skin: Secondary | ICD-10-CM | POA: Insufficient documentation

## 2020-12-04 DIAGNOSIS — G43809 Other migraine, not intractable, without status migrainosus: Secondary | ICD-10-CM | POA: Insufficient documentation

## 2020-12-04 DIAGNOSIS — Z91010 Allergy to peanuts: Secondary | ICD-10-CM | POA: Diagnosis not present

## 2020-12-04 DIAGNOSIS — M5020 Other cervical disc displacement, unspecified cervical region: Secondary | ICD-10-CM

## 2020-12-04 DIAGNOSIS — Z8616 Personal history of COVID-19: Secondary | ICD-10-CM | POA: Diagnosis not present

## 2020-12-04 DIAGNOSIS — R519 Headache, unspecified: Secondary | ICD-10-CM | POA: Diagnosis present

## 2020-12-04 DIAGNOSIS — M5412 Radiculopathy, cervical region: Secondary | ICD-10-CM | POA: Diagnosis not present

## 2020-12-04 NOTE — ED Notes (Signed)
Reviewed pt's cc with Dr Herma Carson Smith--no orders to be given at this time

## 2020-12-04 NOTE — ED Triage Notes (Signed)
Patient ambulatory to triage with steady gait, without difficulty or distress noted; pt reports frontal HA intermittently for last wk; today noted numbness to left hand/fingers; denies any other accomp symptoms; st hx migraines

## 2020-12-05 ENCOUNTER — Emergency Department: Payer: 59

## 2020-12-05 LAB — CBC WITH DIFFERENTIAL/PLATELET
Abs Immature Granulocytes: 0.03 10*3/uL (ref 0.00–0.07)
Basophils Absolute: 0 10*3/uL (ref 0.0–0.1)
Basophils Relative: 0 %
Eosinophils Absolute: 0.2 10*3/uL (ref 0.0–0.5)
Eosinophils Relative: 2 %
HCT: 40.2 % (ref 36.0–46.0)
Hemoglobin: 14.3 g/dL (ref 12.0–15.0)
Immature Granulocytes: 0 %
Lymphocytes Relative: 41 %
Lymphs Abs: 3.3 10*3/uL (ref 0.7–4.0)
MCH: 33.8 pg (ref 26.0–34.0)
MCHC: 35.6 g/dL (ref 30.0–36.0)
MCV: 95 fL (ref 80.0–100.0)
Monocytes Absolute: 0.6 10*3/uL (ref 0.1–1.0)
Monocytes Relative: 7 %
Neutro Abs: 4 10*3/uL (ref 1.7–7.7)
Neutrophils Relative %: 50 %
Platelets: 267 10*3/uL (ref 150–400)
RBC: 4.23 MIL/uL (ref 3.87–5.11)
RDW: 15.1 % (ref 11.5–15.5)
WBC: 8.1 10*3/uL (ref 4.0–10.5)
nRBC: 0 % (ref 0.0–0.2)

## 2020-12-05 LAB — BASIC METABOLIC PANEL
Anion gap: 6 (ref 5–15)
BUN: 8 mg/dL (ref 6–20)
CO2: 23 mmol/L (ref 22–32)
Calcium: 8.9 mg/dL (ref 8.9–10.3)
Chloride: 110 mmol/L (ref 98–111)
Creatinine, Ser: 0.6 mg/dL (ref 0.44–1.00)
GFR, Estimated: >60 mL/min (ref >60)
Glucose, Bld: 91 mg/dL (ref 70–99)
Potassium: 3.5 mmol/L (ref 3.5–5.1)
Sodium: 139 mmol/L (ref 135–145)

## 2020-12-05 MED ORDER — LORAZEPAM 2 MG/ML IJ SOLN
0.5000 mg | Freq: Once | INTRAMUSCULAR | Status: AC
  Administered 2020-12-05: 0.5 mg via INTRAVENOUS
  Filled 2020-12-05: qty 1

## 2020-12-05 MED ORDER — DIPHENHYDRAMINE HCL 50 MG/ML IJ SOLN
25.0000 mg | Freq: Once | INTRAMUSCULAR | Status: AC
  Administered 2020-12-05: 01:00:00 25 mg via INTRAVENOUS
  Filled 2020-12-05: qty 1

## 2020-12-05 MED ORDER — PROCHLORPERAZINE EDISYLATE 10 MG/2ML IJ SOLN
5.0000 mg | Freq: Once | INTRAMUSCULAR | Status: AC
  Administered 2020-12-05: 01:00:00 5 mg via INTRAVENOUS
  Filled 2020-12-05: qty 2

## 2020-12-05 MED ORDER — PROCHLORPERAZINE MALEATE 10 MG PO TABS: 10.0000 mg | ORAL_TABLET | Freq: Four times a day (QID) | ORAL | 0 refills | Status: DC | PRN

## 2020-12-05 MED ORDER — METHYLPREDNISOLONE 4 MG PO TBPK: ORAL_TABLET | ORAL | 0 refills | Status: DC

## 2020-12-05 NOTE — Discharge Instructions (Addendum)
1.  Take steroid taper as prescribed (Medrol Dosepak). 2.  You may take Compazine as needed for headache. 3.  Return to the ER for worsening symptoms, persistent vomiting, difficulty breathing or other concerns.

## 2020-12-05 NOTE — ED Provider Notes (Signed)
[facility] Emergency Department Provider Note   ____________________________________________   Event Date/Time   First MD Initiated Contact with Patient 12/05/20 0016     (approximate)  I have reviewed the triage vital signs and the nursing notes.   HISTORY  Chief Complaint Headache    HPI Valerie Hines is a 30 y.o. female who presents to the ED from home with a chief complaint of headache.  Patient has a history of frequent headaches, nor formal diagnosis of migraine disorder.  Had COVID-19 in April 2022 and states headaches have become more frequent and intense.  Reports frontal headache intermittently for the past several weeks with occasional numbness to her left 3rd-5th digits.  Denies associated photophobia, neck pain, chest pain, shortness of breath, abdominal pain, nausea, vomiting or dizziness.  Denies slurred speech, facial droop, extremity weakness, bowel or bladder incontinence.     Past Medical History:  Diagnosis Date   Chlamydia 2017/ 2018   Morbid obesity with BMI of 40.0-44.9, adult (HCC)    Tobacco use disorder 05/12/2017    Patient Active Problem List   Diagnosis Date Noted   Morbid obesity with BMI of 40.0-44.9, adult [facility])     Past Surgical History:  Procedure Laterality Date   INDUCED ABORTION     TONSILLECTOMY     WISDOM TOOTH EXTRACTION      Prior to Admission medications   Medication Sig Start Date End Date Taking? Authorizing Provider  methylPREDNISolone (MEDROL DOSEPAK) 4 MG TBPK tablet Take as directed 12/05/20  Yes Irean Hong, MD  prochlorperazine (COMPAZINE) 10 MG tablet Take 1 tablet (10 mg total) by mouth every 6 (six) hours as needed for nausea (headache). 12/05/20  Yes Irean Hong, MD  albuterol (PROVENTIL HFA;VENTOLIN HFA) 108 (90 Base) MCG/ACT inhaler Inhale 2 puffs into the lungs every 6 (six) hours as needed for wheezing or shortness of breath. Patient not taking: Reported on 05/25/2020 09/16/18   Sharyn Creamer, MD  azithromycin (ZITHROMAX) 250 MG tablet Take 1 tablet (250 mg total) by mouth daily. Take first 2 tablets together, then 1 every day until finished. Patient not taking: Reported on 05/25/2020 01/27/20   Mickie Bail, NP  levonorgestrel-ethinyl estradiol (NORDETTE) 0.15-30 MG-MCG tablet Take one active tablet daily x 3 packs, skipping the placebos in the first two packs, and repeat cycle 05/25/20   Mirna Mires, CNM  ondansetron (ZOFRAN ODT) 4 MG disintegrating tablet Take 1 tablet (4 mg total) by mouth every 8 (eight) hours as needed. 10/02/20   Jene Every, MD    Allergies Peanut-containing drug products  Family History  Problem Relation Age of Onset   Thyroid disease Mother    Thyroid disease Maternal Grandmother    Hypertension Maternal Grandmother    Thyroid disease Maternal Grandfather    Hypertension Maternal Grandfather     Social History Social History   Tobacco Use   Smoking status: Former    Packs/day: 0.50    Pack years: 0.00    Types: Cigarettes   Smokeless tobacco: Never   Tobacco comments:    started smoking at age 50, stopped for 3 years  Vaping Use   Vaping Use: Former  Substance Use Topics   Alcohol use: Yes    Comment: socially   Drug use: No    Review of Systems  Constitutional: No fever/chills Eyes: No visual changes. ENT: No sore throat. Cardiovascular: Denies chest pain. Respiratory: Denies shortness of breath. Gastrointestinal: No abdominal pain.  No nausea, no vomiting.  No diarrhea.  No constipation. Genitourinary: Negative for dysuria. Musculoskeletal: Negative for back pain. Skin: Negative for rash. Neurological: Positive for headaches and numbness.  Negative for focal weakness.   ____________________________________________   PHYSICAL EXAM:  VITAL SIGNS: ED Triage Vitals [12/04/20 2134]  Enc Vitals Group     BP (!) 141/78     Pulse Rate (!) 114     Resp 18     Temp 98.3 F (36.8 C)     Temp Source Oral      SpO2 96 %     Weight (!) 305 lb (138.3 kg)     Height 5\' 8"  (1.727 m)     Head Circumference      Peak Flow      Pain Score 7     Pain Loc      Pain Edu?      Excl. in GC?     Constitutional: Alert and oriented. Well appearing and in no acute distress. Eyes: Conjunctivae are normal. PERRL. EOMI. Head: Atraumatic. Nose: No congestion/rhinnorhea. Mouth/Throat: Mucous membranes are moist.   Neck: No stridor.  Supple neck without meningismus.  No carotid bruits. Cardiovascular: Normal rate, regular rhythm. Grossly normal heart sounds.  Good peripheral circulation. Respiratory: Normal respiratory effort.  No retractions. Lungs CTAB. Gastrointestinal: Soft and nontender. No distention. No abdominal bruits. No CVA tenderness. Musculoskeletal: No lower extremity tenderness nor edema.  No joint effusions. Neurologic: Alert and oriented x3.  CN II to XII grossly intact.  Normal speech and language.  5/5 motor strength and sensation all extremities. No gross focal neurologic deficits are appreciated. No gait instability. Skin:  Skin is warm, dry and intact. No rash noted.  No petechiae. Psychiatric: Mood and affect are normal. Speech and behavior are normal.  ____________________________________________   LABS (all labs ordered are listed, but only abnormal results are displayed)  Labs Reviewed  CBC WITH DIFFERENTIAL/PLATELET  BASIC METABOLIC PANEL   ____________________________________________  EKG  None ____________________________________________  RADIOLOGY I, Mahealani Sulak J, personally viewed and evaluated these images (plain radiographs) as part of my medical decision making, as well as reviewing the written report by the radiologist.  ED MD interpretation: Unremarkable MRI brain; C4-5 small disc protrusion  Official radiology report(s): MR BRAIN WO CONTRAST  Result Date: 12/05/2020 CLINICAL DATA:  Headache and left upper extremity numbness EXAM: MRI HEAD WITHOUT CONTRAST  TECHNIQUE: Multiplanar, multiecho pulse sequences of the brain and surrounding structures were obtained without intravenous contrast. COMPARISON:  None. FINDINGS: Brain: No acute infarct, mass effect or extra-axial collection. No acute or chronic hemorrhage. Normal white matter signal, parenchymal volume and CSF spaces. The midline structures are normal. Vascular: Major flow voids are preserved. Skull and upper cervical spine: Normal calvarium and skull base. Visualized upper cervical spine and soft tissues are normal. Sinuses/Orbits:No paranasal sinus fluid levels or advanced mucosal thickening. No mastoid or middle ear effusion. Normal orbits. IMPRESSION: Normal brain MRI. Electronically Signed   By: 12/07/2020 M.D.   On: 12/05/2020 01:54   MR Cervical Spine Wo Contrast  Result Date: 12/05/2020 CLINICAL DATA:  Left upper extremity numbness EXAM: MRI CERVICAL SPINE WITHOUT CONTRAST TECHNIQUE: Multiplanar, multisequence MR imaging of the cervical spine was performed. No intravenous contrast was administered. COMPARISON:  None. FINDINGS: Alignment: Reversal of normal cervical lordosis may be positional or due to muscle spasm. Vertebrae: No fracture, evidence of discitis, or bone lesion. Cord: Normal signal and morphology. Posterior Fossa, vertebral arteries, paraspinal tissues: Negative. Disc  levels: C1-2: Unremarkable. C2-3: Normal disc space and facet joints. There is no spinal canal stenosis. No neural foraminal stenosis. C3-4: Normal disc space and facet joints. There is no spinal canal stenosis. No neural foraminal stenosis. C4-5: Small central disc protrusion. There is no spinal canal stenosis. No neural foraminal stenosis. C5-6: Normal disc space and facet joints. There is no spinal canal stenosis. No neural foraminal stenosis. C6-7: Minimal disc bulge. There is no spinal canal stenosis. No neural foraminal stenosis. C7-T1: Normal disc space and facet joints. There is no spinal canal stenosis. No neural  foraminal stenosis. IMPRESSION: 1. No acute abnormality of the cervical spine. 2. No spinal canal or neural foraminal stenosis. 3. Small central disc protrusion at C4-5 and minimal disc bulge at C6-7. Electronically Signed   By: Deatra Robinson M.D.   On: 12/05/2020 02:01    ____________________________________________   PROCEDURES  Procedure(s) performed (including Critical Care):  Procedures   ____________________________________________   INITIAL IMPRESSION / ASSESSMENT AND PLAN / ED COURSE  As part of my medical decision making, I reviewed the following data within the electronic MEDICAL RECORD NUMBER Nursing notes reviewed and incorporated, Labs reviewed, Old chart reviewed, Radiograph reviewed, and Notes from prior ED visits     30 year old female presenting with headaches and left arm numbness. Differential diagnosis includes, but is not limited to, intracranial hemorrhage, meningitis/encephalitis, previous head trauma, cavernous venous thrombosis, tension headache, temporal arteritis, migraine or migraine equivalent, idiopathic intracranial hypertension, and non-specific headache.   Will obtain basic lab work, MRI brain and cervical spine.  Administer headache cocktail comprising of IV Compazine and Benadryl.  Will administer low-dose Ativan prior to MRI.  Will reassess  Clinical Course as of 12/05/20 0427  Tue Dec 05, 2020  0218 Updated patient and family member on MRI results.  Will discharge home with prescription for Compazine to use as needed for headaches, Medrol Dosepak for cervical radiculopathy.  Will refer to neurology for follow-up.  Strict return precautions given.  Both verbalized understanding agree with plan of care. [JS]    Clinical Course User Index [JS] Irean Hong, MD     ____________________________________________   FINAL CLINICAL IMPRESSION(S) / ED DIAGNOSES  Final diagnoses:  Other migraine without status migrainosus, not intractable  Paresthesia   Cervical radiculopathy     ED Discharge Orders          Ordered    prochlorperazine (COMPAZINE) 10 MG tablet  Every 6 hours PRN        12/05/20 0220    methylPREDNISolone (MEDROL DOSEPAK) 4 MG TBPK tablet        12/05/20 0220             Note:  This document was prepared using Dragon voice recognition software and may include unintentional dictation errors.    Irean Hong, MD 12/05/20 [phone]

## 2020-12-05 NOTE — ED Notes (Signed)
Pt removed her clothing and provided a hospital gown for MRI scan.

## 2020-12-05 NOTE — ED Notes (Signed)
Pt endorses a frontal headache and posterior neck pain for the last week intermittently, with a new onset of left hand numbness beginning tonight. Denies SOB and CP. No additional concerns at this time.

## 2021-02-13 ENCOUNTER — Telehealth: Payer: Self-pay

## 2021-02-13 DIAGNOSIS — Z3041 Encounter for surveillance of contraceptive pills: Secondary | ICD-10-CM

## 2021-02-13 MED ORDER — LEVONORGESTREL-ETHINYL ESTRAD 0.15-30 MG-MCG PO TABS: ORAL_TABLET | ORAL | 0 refills | Status: DC

## 2021-02-13 NOTE — Telephone Encounter (Signed)
Pt calling for refill of bc; annual scheduled for 05/29/21.  [phone]  Pt aware refill sent.

## 2021-02-16 DIAGNOSIS — R519 Headache, unspecified: Secondary | ICD-10-CM | POA: Insufficient documentation

## 2021-02-16 DIAGNOSIS — F39 Unspecified mood [affective] disorder: Secondary | ICD-10-CM | POA: Insufficient documentation

## 2021-04-18 ENCOUNTER — Other Ambulatory Visit: Payer: Self-pay

## 2021-04-18 DIAGNOSIS — Z3041 Encounter for surveillance of contraceptive pills: Secondary | ICD-10-CM

## 2021-04-18 MED ORDER — LEVONORGESTREL-ETHINYL ESTRAD 0.15-30 MG-MCG PO TABS: ORAL_TABLET | ORAL | 0 refills | Status: DC

## 2021-04-18 NOTE — Telephone Encounter (Signed)
Pt calling; has scheduled appt; needs refill of bc to get to appt.  [phone]  Pt aware refill eRx'd.

## 2021-05-29 ENCOUNTER — Ambulatory Visit (INDEPENDENT_AMBULATORY_CARE_PROVIDER_SITE_OTHER): Payer: 59 | Admitting: Obstetrics

## 2021-05-29 ENCOUNTER — Other Ambulatory Visit: Payer: Self-pay

## 2021-05-29 ENCOUNTER — Other Ambulatory Visit (HOSPITAL_COMMUNITY)
Admission: RE | Admit: 2021-05-29 | Discharge: 2021-05-29 | Disposition: A | Discharge status: Home / Self Care | Payer: 59 | Source: Ambulatory Visit | Attending: Obstetrics | Admitting: Obstetrics

## 2021-05-29 ENCOUNTER — Encounter: Payer: Self-pay | Admitting: Obstetrics

## 2021-05-29 VITALS — BP 128/84 | Ht 68.0 in | Wt 297.0 lb

## 2021-05-29 DIAGNOSIS — Z3041 Encounter for surveillance of contraceptive pills: Secondary | ICD-10-CM | POA: Diagnosis not present

## 2021-05-29 DIAGNOSIS — N898 Other specified noninflammatory disorders of vagina: Secondary | ICD-10-CM | POA: Diagnosis not present

## 2021-05-29 MED ORDER — LEVONORGESTREL-ETHINYL ESTRAD 0.15-30 MG-MCG PO TABS: ORAL_TABLET | ORAL | 3 refills | Status: DC

## 2021-05-29 NOTE — Progress Notes (Signed)
Gynecology Annual Exam   PCP: Pcp, No  Chief Complaint:  Chief Complaint  Patient presents with   Annual Exam    History of Present Illness: Patient is a 30 y.o. G1P0010 presents for annual exam. The patient has no complaints today.   LMP: No LMP recorded. (Menstrual status: Oral contraceptives).She does not get periods as she takes her OCP continuously.  Has been having migraine headaches for the last 8 months. Seen by Neuro, had an MRI, and made dietary changes. Has lost 28 lbs.On mew med for headaches, but continues to have them daily. 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  The patient is sexually active. She currently uses OCP (estrogen/progesterone) for contraception. She denies dyspareunia.  The patient does perform self breast exams.  There is no notable family history of breast or ovarian cancer in her family.  The patient wears seatbelts: yes.   The patient has regular exercise: no.    The patient denies current symptoms of depression.    Review of Systems: Review of Systems  Constitutional: Negative.   HENT: Negative.    Eyes: Negative.   Respiratory: Negative.    Cardiovascular: Negative.   Gastrointestinal: Negative.   Genitourinary: Negative.   Musculoskeletal: Negative.   Skin: Negative.   Neurological: Negative.   Endo/Heme/Allergies: Negative.   Psychiatric/Behavioral: Negative.     Past Medical History:  Patient Active Problem List   Diagnosis Date Noted   Mood disorder (HCC) 02/16/2021    Last Assessment & Plan:  Formatting of this note is different from the original. // Anxiety and OCD Tendencies: Improved  - Last GAD 7:      and percent change: GAD 7 change: 0 - Last PHQ9:  PHQ-9 PHQ-9 TOTAL SCORE  04/13/2021 7  03/13/2021 7  02/16/2021 16   - Currently sertraline 100mg  - Educated on at least 150 minutes of exercise a week as well as a healthy diet.   -  Educated on getting appropriate amounts of sleep at night with a goal of 7-8 hours  - Educated on therapy as good option of treatment - Recommendation for Headspace and Calm applications  - Follow up in 6 weeks.   Future options: Increase to 200mg  sv. Augment with antipsychotic    Complaint of headache 02/16/2021    Last Assessment & Plan:  Formatting of this note might be different from the original. 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 No effect from intranasal and PO sumatriptan. Also considered neurtec or is non-responder to Triptans. - Prevention with increased Topamax.   - Is seeing neurologist and asked about possibility for injecitons like amovig in follow up - Counseled on getting enough sleep, and going to bed and waking up at consistent times. Stress management such as yoga and meditation. Being sedentary can lead to chronification of headaches, so encouraged exercise. Encouraged healthy eating choices - Follow up in 2 months    Morbid obesity with BMI of 40.0-44.9, adult [facility])     Past Surgical History:  Past Surgical History:  Procedure Laterality Date   INDUCED ABORTION     TONSILLECTOMY     WISDOM TOOTH EXTRACTION      Gynecologic History:  No LMP recorded. (Menstrual status: Oral contraceptives). Contraception: OCP (estrogen/progesterone) Last Pap: Results were: no abnormalities   Obstetric History:  G1P0010  Family History:  Family History  Problem Relation Age of Onset   Thyroid disease Mother    Thyroid disease Maternal Grandmother    Hypertension Maternal Grandmother    Thyroid disease Maternal Grandfather    Hypertension Maternal Grandfather     Social History:  Social History   Socioeconomic History   Marital status: Divorced    Spouse name: Not on file   Number of children: 0   Years of education: Not on file   Highest education  level: Not on file  Occupational History   Occupation: Public librarian  Tobacco Use   Smoking status: Former    Packs/day: 0.50    Types: Cigarettes   Smokeless tobacco: Never   Tobacco comments:    started smoking at age 65, stopped for 3 years  Vaping Use   Vaping Use: Former  Substance and Sexual Activity   Alcohol use: Yes    Comment: socially   Drug use: No   Sexual activity: Yes    Birth control/protection: Pill  Other Topics Concern   Not on file  Social History Narrative   Not on file   Social Determinants of Health   Financial Resource Strain: Not on file  Food Insecurity: Not on file  Transportation Needs: Not on file  Physical Activity: Not on file  Stress: Not on file  Social Connections: Not on file  Intimate Partner Violence: Not on file    Allergies:  Allergies  Allergen Reactions   Peanut-Containing Drug Products Anaphylaxis    Other tree nuts and soybeans also    Medications: Prior to Admission medications   Medication Sig Start Date End Date Taking? Authorizing Provider  albuterol (PROVENTIL HFA;VENTOLIN HFA) 108 (90 Base) MCG/ACT inhaler Inhale 2 puffs into the lungs every 6 (six) hours as needed for wheezing or shortness of breath. 09/16/18  Yes Sharyn Creamer, MD  baclofen (LIORESAL) 10 MG tablet  04/08/21  Yes [provider]  EPINEPHrine 0.3 mg/0.3 mL IJ SOAJ injection Inject into the muscle. 02/16/21  Yes [provider]  fluticasone (FLONASE) 50 MCG/ACT nasal spray Place into the nose.   Yes [provider]  levonorgestrel-ethinyl estradiol (NORDETTE) 0.15-30 MG-MCG tablet Take one active tablet daily x 3 packs, skipping the placebos in the first two packs, and repeat cycle 04/18/21  Yes Mirna Mires, CNM  ondansetron (ZOFRAN ODT) 4 MG disintegrating tablet Take 1 tablet (4 mg total) by mouth every 8 (eight) hours as needed. 10/02/20  Yes Jene Every, MD  rizatriptan (MAXALT-MLT) 10 MG disintegrating tablet  SMARTSIG:1 Tablet(s) By Mouth 1-2 Times Daily 04/13/21  Yes [provider]  sertraline (ZOLOFT) 50 MG tablet Take 50 mg by mouth daily. 04/08/21  Yes [provider]  topiramate (TOPAMAX) 50 MG tablet Take 50 mg by mouth 2 (two) times daily. 04/30/21  Yes [provider]  azithromycin (ZITHROMAX) 250 MG tablet Take 1 tablet (250 mg total) by mouth daily. Take first 2 tablets together, then 1 every day until finished. Patient not taking: Reported on 05/25/2020 01/27/20   Mickie Bail, NP    Physical Exam Vitals: Blood pressure 128/84, height 5\' 8"  (1.727 m), weight 297 lb (134.7 kg).  General: NAD HEENT: normocephalic, anicteric Thyroid: no enlargement, no palpable nodules Pulmonary: No increased work of breathing, CTAB Cardiovascular: RRR, distal pulses 2+ Breast: Breast symmetrical, no tenderness, no palpable nodules or masses, no skin or nipple retraction present, no nipple discharge.  No axillary or supraclavicular lymphadenopathy.  Abdomen: NABS, soft, non-tender, non-distended.  Umbilicus without lesions.  No hepatomegaly, splenomegaly or masses palpable. No evidence of hernia  Genitourinary:  External: Normal external female genitalia.  Normal urethral meatus, normal Bartholin's and Skene's glands.    Vagina: Normal vaginal mucosa, no evidence of prolapse.    Cervix: Grossly normal in appearance, no bleeding  Uterus: Non-enlarged, mobile, normal contour.  No CMT  Adnexa: ovaries non-enlarged, no adnexal masses  Rectal: deferred  Lymphatic: no evidence of inguinal lymphadenopathy Extremities: no edema, erythema, or tenderness Neurologic: Grossly intact Psychiatric: mood appropriate, affect full  Female chaperone present for pelvic and breast  portions of the physical exam    Assessment: 30 y.o. G1P0010 routine annual exam  Plan: Problem List Items Addressed This Visit   None Visit Diagnoses     Vaginal discharge    -  Primary   Relevant Orders    Cervicovaginal ancillary only   Encounter for surveillance of contraceptive pills       Relevant Medications   levonorgestrel-ethinyl estradiol (NORDETTE) 0.15-30 MG-MCG tablet       2) STI screening  wasoffered and declined  2)  ASCCP guidelines and rational discussed.  Patient opts for every 3 years screening interval  3) Contraception - the patient is currently using  OCP (estrogen/progesterone).  She is happy with her current form of contraception and plans to continue   We did discuss the option of switching to a progesterone only pill. She will discuss this with her Neuro doc and get bak to [place] for any change.  5) Return in about 1 year (around 05/29/2022) for annual, contraception.   Mirna Mires, CNM  05/29/2021 2:23 PM   [facility], [facility] 05/29/2021, 2:23 PM

## 2021-05-31 LAB — CERVICOVAGINAL ANCILLARY ONLY
Bacterial Vaginitis (gardnerella): NEGATIVE
Candida Glabrata: NEGATIVE
Candida Vaginitis: POSITIVE — AB
Comment: NEGATIVE
Comment: NEGATIVE
Comment: NEGATIVE

## 2021-06-01 ENCOUNTER — Encounter: Payer: Self-pay | Admitting: Obstetrics

## 2021-06-01 DIAGNOSIS — B3731 Acute candidiasis of vulva and vagina: Secondary | ICD-10-CM

## 2021-06-01 MED ORDER — FLUCONAZOLE 150 MG PO TABS: 150.0000 mg | ORAL_TABLET | Freq: Once | ORAL | 0 refills | Status: AC

## 2021-06-01 NOTE — Progress Notes (Signed)
RX for Diflucan sent in for patient for a +yeast vaginitis result at her recent Annual Exam visit.  Mirna Mires, CNM  06/01/2021 3:36 PM

## 2022-02-27 ENCOUNTER — Telehealth: Payer: Self-pay

## 2022-02-27 DIAGNOSIS — Z3041 Encounter for surveillance of contraceptive pills: Secondary | ICD-10-CM

## 2022-02-27 MED ORDER — LEVONORGESTREL-ETHINYL ESTRAD 0.15-30 MG-MCG PO TABS: ORAL_TABLET | ORAL | 0 refills | Status: AC

## 2022-02-27 NOTE — Telephone Encounter (Signed)
Pt needs BC RF. Called [facility] here in [place] to confirm no more refills, confirmed. Pt requesting RF to be sent to her new pharmacy, [facility] in [place], [place]. Rx RF sent, pt aware.

## 2022-03-20 IMAGING — MR MR CERVICAL SPINE W/O CM
5 series · 40 of 48 positions shown · non-contrast
Comparison: None.

CLINICAL DATA: Left upper extremity numbness

EXAM:
MRI CERVICAL SPINE WITHOUT CONTRAST
TECHNIQUE: Multiplanar, multisequence MR imaging of the cervical spine was
performed. No intravenous contrast was administered.

[Series 22: T2 · sagittal · 3.0mm · 0.62mm/px · 6 of 15 slices shown (1 of 2)]
[im 1/15]
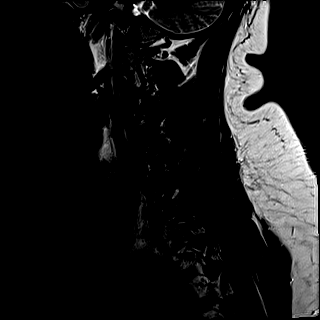
[im 3/15]
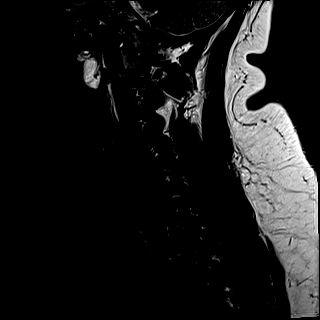
[im 6/15]
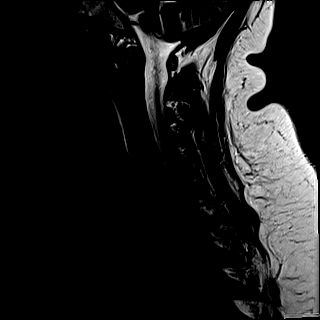
[im 9/15]
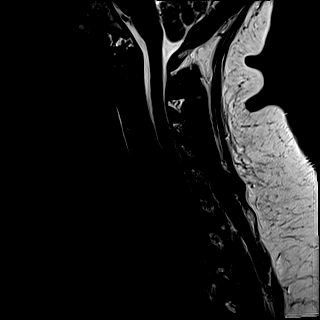
[im 12/15]
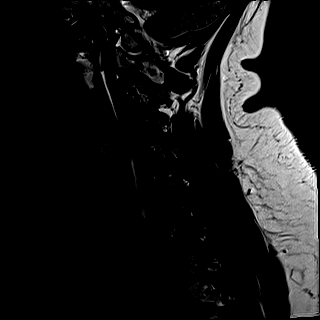
[im 15/15]
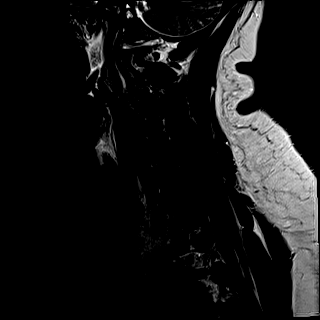

[Series 23: FLAIR · sagittal · 3.0mm · 0.78mm/px · 6 of 15 slices shown]
[im 1/15]
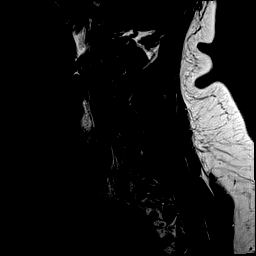
[im 3/15]
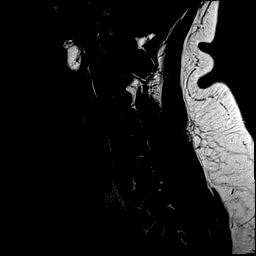
[im 6/15]
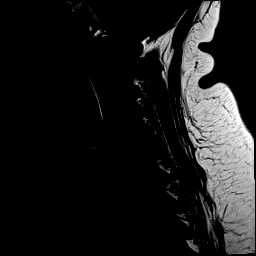
[im 9/15]
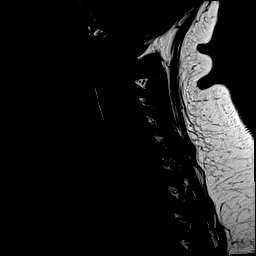
[im 12/15]
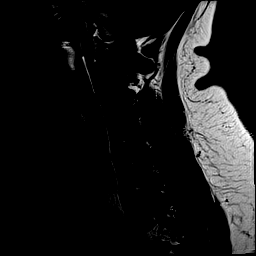
[im 15/15]
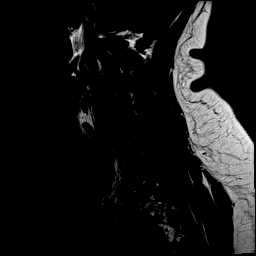

[Series 24: STIR · sagittal · 3.0mm · 0.62mm/px · 6 of 15 slices shown]
[im 1/15]
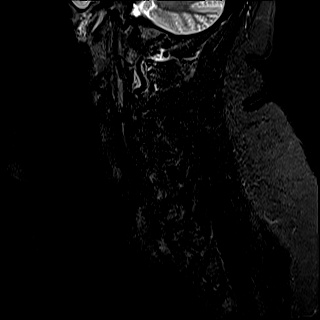
[im 3/15]
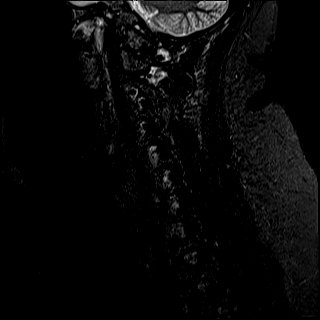
[im 6/15]
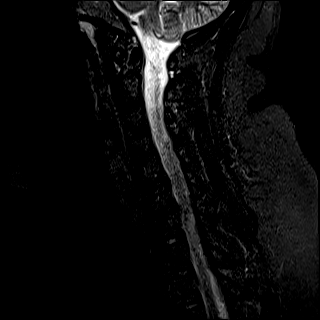
[im 9/15]
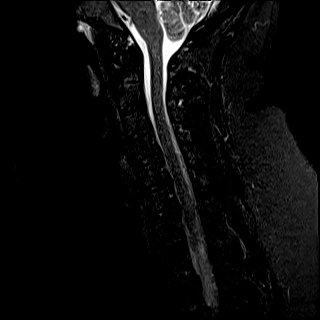
[im 12/15]
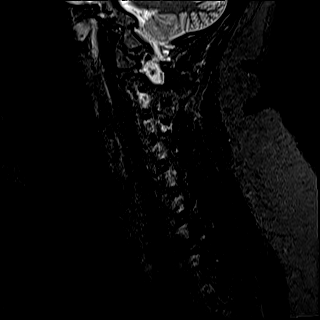
[im 15/15]
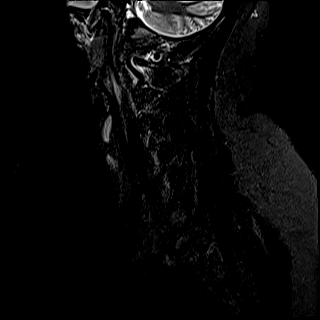

[Series 25: T2 · axial · 3.0mm · 0.70mm/px · z∈[-256,-141]mm · 14 of 35 slices shown (2 of 2)]
[im 1/35]
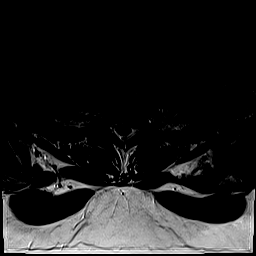
[im 3/35]
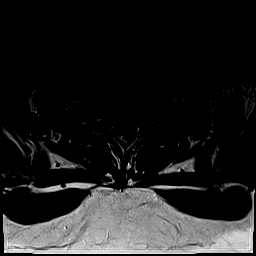
[im 5/35]
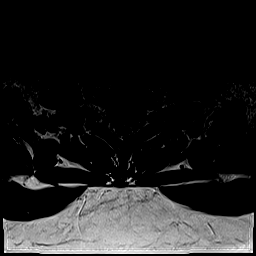
[im 8/35]
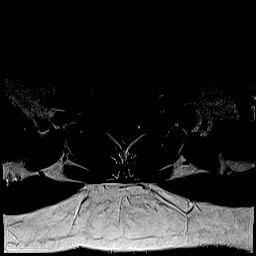
[im 10/35]
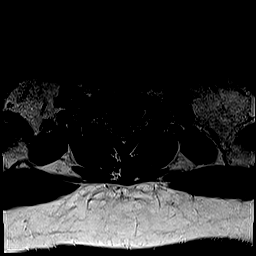
[im 13/35]
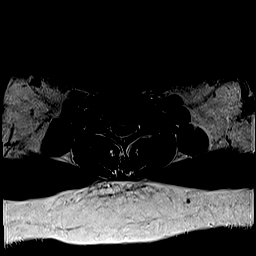
[im 15/35]
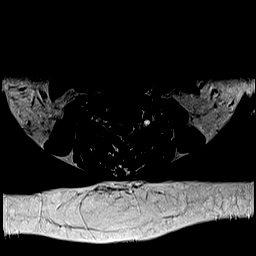
[im 18/35]
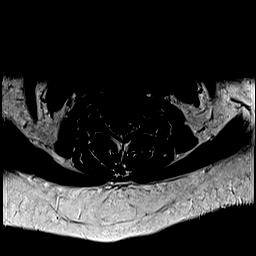
[im 20/35]
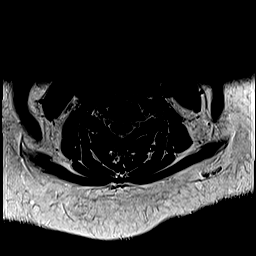
[im 22/35]
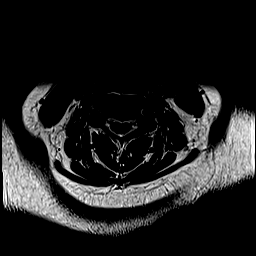
[im 25/35]
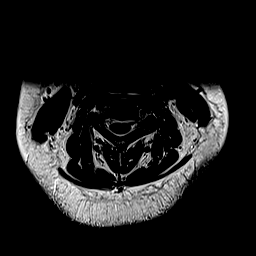
[im 27/35]
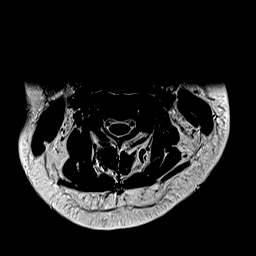
[im 30/35]
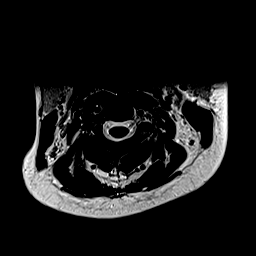
[im 35/35]
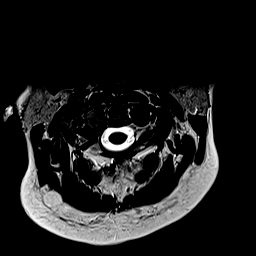

[Series 26: ax mpgr · axial · 3.0mm · 0.35mm/px · z∈[-256,-141]mm · 8 of 35 slices shown]
[im 1/35]
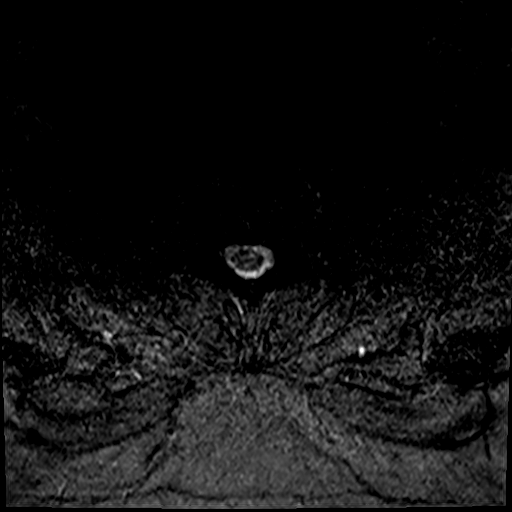
[im 5/35]
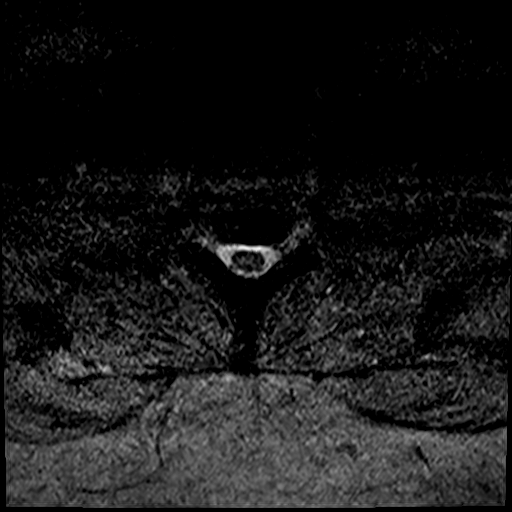
[im 10/35]
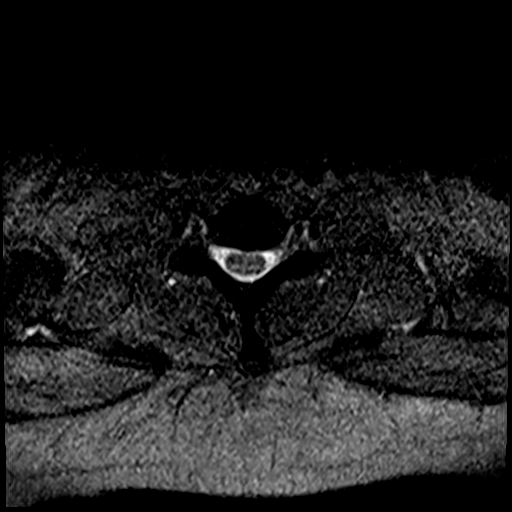
[im 15/35]
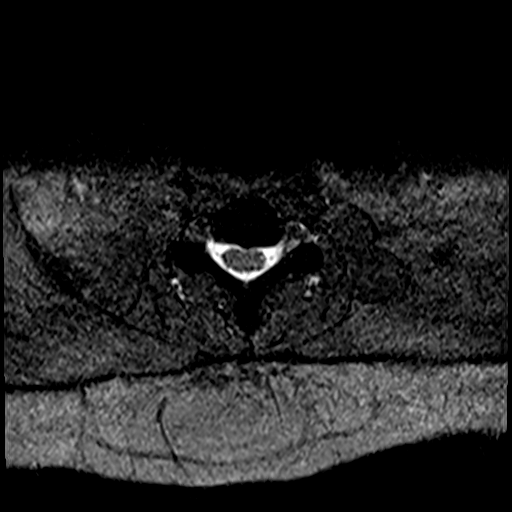
[im 20/35]
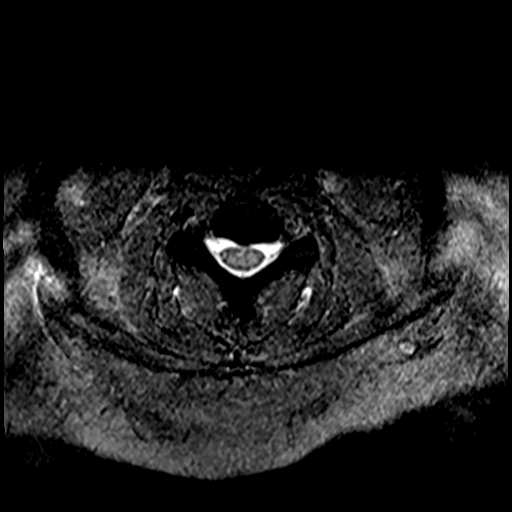
[im 25/35]
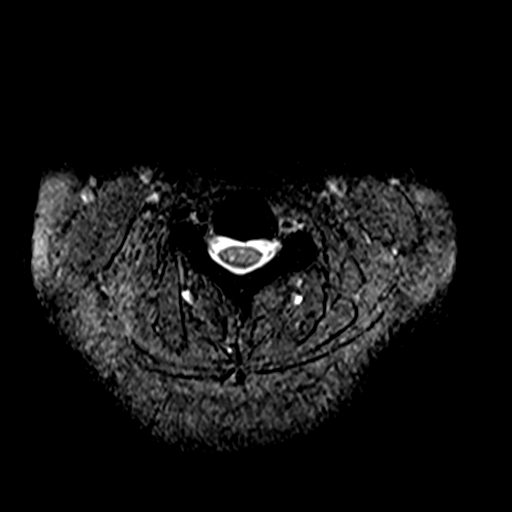
[im 30/35]
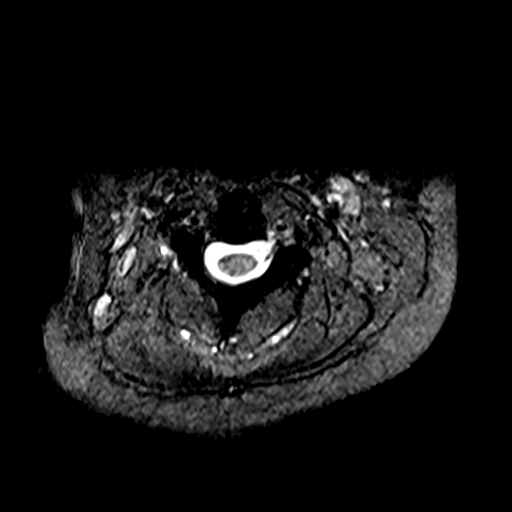
[im 35/35]
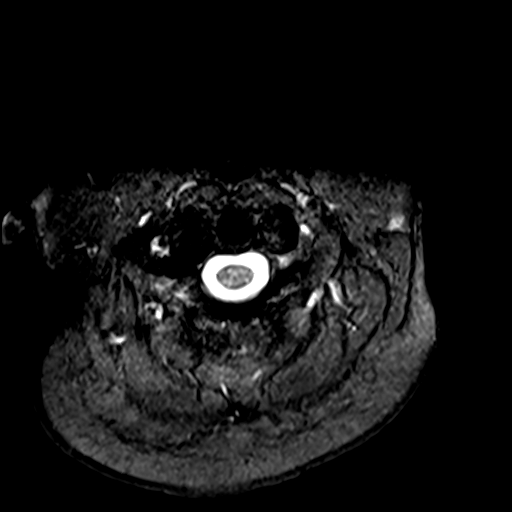

[40 of 48 positions shown; findings below may reference images not displayed]

FINDINGS: Alignment: Reversal of normal cervical lordosis may be positional or
due to muscle spasm.

Vertebrae: No fracture, evidence of discitis, or bone lesion.

Cord: Normal signal and morphology.

Posterior Fossa, vertebral arteries, paraspinal tissues: Negative.

Disc levels:

C1-2: Unremarkable.

C2-3: Normal disc space and facet joints. There is no spinal canal
stenosis. No neural foraminal stenosis.

C3-4: Normal disc space and facet joints. There is no spinal canal
stenosis. No neural foraminal stenosis.

C4-5: Small central disc protrusion. There is no spinal canal
stenosis. No neural foraminal stenosis.

C5-6: Normal disc space and facet joints. There is no spinal canal
stenosis. No neural foraminal stenosis.

C6-7: Minimal disc bulge. There is no spinal canal stenosis. No
neural foraminal stenosis.

C7-T1: Normal disc space and facet joints. There is no spinal canal
stenosis. No neural foraminal stenosis.
IMPRESSION: 1. No acute abnormality of the cervical spine.
2. No spinal canal or neural foraminal stenosis.
3. Small central disc protrusion at C4-5 and minimal disc bulge at
C6-7.
